# Patient Record
Sex: Male | Born: 2000 | Race: White | Hispanic: No | Marital: Single | Smoking: Never smoker
Health system: Southern US, Community
[De-identification: ages and names within clinical notes are randomized; demographics above are authoritative.]

## PROBLEM LIST (undated history)

## (undated) HISTORY — PX: TONSILLECTOMY: SUR1361

---
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
## 2022-01-21 ENCOUNTER — Telehealth: Payer: Medicaid Other | Admitting: Physician Assistant

## 2022-01-21 DIAGNOSIS — J019 Acute sinusitis, unspecified: Secondary | ICD-10-CM

## 2022-01-21 DIAGNOSIS — B9689 Other specified bacterial agents as the cause of diseases classified elsewhere: Secondary | ICD-10-CM

## 2022-01-21 MED ORDER — AMOXICILLIN-POT CLAVULANATE 875-125 MG PO TABS: 1.0000 | ORAL_TABLET | Freq: Two times a day (BID) | ORAL | 0 refills | Status: DC

## 2022-01-21 MED ORDER — FLUTICASONE PROPIONATE 50 MCG/ACT NA SUSP: 2.0000 | Freq: Every day | NASAL | 0 refills | Status: DC

## 2022-01-21 NOTE — Patient Instructions (Signed)
Charles Whitney, thank you for joining Charles Loveless, PA-C for today's virtual visit.  While this provider is not your primary care provider (PCP), if your PCP is located in our provider database this encounter information will be shared with them immediately following your visit.   A [facility] MyChart account gives you access to today's visit and all your visits, tests, and labs performed at [facility] " click here if you don't have a [facility] MyChart account or go to [email or web address]  Consent: (Patient) Charles Whitney provided verbal consent for this virtual visit at the beginning of the encounter.  Current Medications:  Current Outpatient Medications:    amoxicillin-clavulanate (AUGMENTIN) 875-125 MG tablet, Take 1 tablet by mouth 2 (two) times daily., Disp: 14 tablet, Rfl: 0   fluticasone (FLONASE) 50 MCG/ACT nasal spray, Place 2 sprays into both nostrils daily., Disp: 16 g, Rfl: 0   Medications ordered in this encounter:  Meds ordered this encounter  Medications   amoxicillin-clavulanate (AUGMENTIN) 875-125 MG tablet    Sig: Take 1 tablet by mouth 2 (two) times daily.    Dispense:  14 tablet    Refill:  0    Order Specific Question:   Supervising Provider    Answer:   Charles Whitney [[id number]]   fluticasone (FLONASE) 50 MCG/ACT nasal spray    Sig: Place 2 sprays into both nostrils daily.    Dispense:  16 g    Refill:  0    Order Specific Question:   Supervising Provider    Answer:   Charles Whitney [id number]     *If you need refills on other medications prior to your next appointment, please contact your pharmacy*  Follow-Up: Call back or seek an in-person evaluation if the symptoms worsen or if the condition fails to improve as anticipated.  [facility] [phone]  Other Instructions  Sinus Infection, Adult A sinus infection, also called sinusitis, is inflammation of your sinuses. Sinuses are hollow  spaces in the bones around your face. Your sinuses are located: Around your eyes. In the middle of your forehead. Behind your nose. In your cheekbones. Mucus normally drains out of your sinuses. When your nasal tissues become inflamed or swollen, mucus can become trapped or blocked. This allows bacteria, viruses, and fungi to grow, which leads to infection. Most infections of the sinuses are caused by a virus. A sinus infection can develop quickly. It can last for up to 4 weeks (acute) or for more than 12 weeks (chronic). A sinus infection often develops after a cold. What are the causes? This condition is caused by anything that creates swelling in the sinuses or stops mucus from draining. This includes: Allergies. Asthma. Infection from bacteria or viruses. Deformities or blockages in your nose or sinuses. Abnormal growths in the nose (nasal polyps). Pollutants, such as chemicals or irritants in the air. Infection from fungi. This is rare. What increases the risk? You are more likely to develop this condition if you: Have a weak body defense system (immune system). Do a lot of swimming or diving. Overuse nasal sprays. Smoke. What are the signs or symptoms? The main symptoms of this condition are pain and a feeling of pressure around the affected sinuses. Other symptoms include: Stuffy nose or congestion that makes it difficult to breathe through your nose. Thick yellow or greenish drainage from your nose. Tenderness, swelling, and warmth over the affected sinuses. A cough that may get worse at night.  Decreased sense of smell and taste. Extra mucus that collects in the throat or the back of the nose (postnasal drip) causing a sore throat or bad breath. Tiredness (fatigue). Fever. How is this diagnosed? This condition is diagnosed based on: Your symptoms. Your medical history. A physical exam. Tests to find out if your condition is acute or chronic. This may include: Checking  your nose for nasal polyps. Viewing your sinuses using a device that has a light (endoscope). Testing for allergies or bacteria. Imaging tests, such as an MRI or CT scan. In rare cases, a bone biopsy may be done to rule out more serious types of fungal sinus disease. How is this treated? Treatment for a sinus infection depends on the cause and whether your condition is chronic or acute. If caused by a virus, your symptoms should go away on their own within 10 days. You may be given medicines to relieve symptoms. They include: Medicines that shrink swollen nasal passages (decongestants). A spray that eases inflammation of the nostrils (topical intranasal corticosteroids). Rinses that help get rid of thick mucus in your nose (nasal saline washes). Medicines that treat allergies (antihistamines). Over-the-counter pain relievers. If caused by bacteria, your health care provider may recommend waiting to see if your symptoms improve. Most bacterial infections will get better without antibiotic medicine. You may be given antibiotics if you have: A severe infection. A weak immune system. If caused by narrow nasal passages or nasal polyps, surgery may be needed. Follow these instructions at home: Medicines Take, use, or apply over-the-counter and prescription medicines only as told by your health care provider. These may include nasal sprays. If you were prescribed an antibiotic medicine, take it as told by your health care provider. Do not stop taking the antibiotic even if you start to feel better. Hydrate and humidify  Drink enough fluid to keep your urine pale yellow. Staying hydrated will help to thin your mucus. Use a cool mist humidifier to keep the humidity level in your home above 50%. Inhale steam for 10-15 minutes, 3-4 times a day, or as told by your health care provider. You can do this in the bathroom while a hot shower is running. Limit your exposure to cool or dry air. Rest Rest as  much as possible. Sleep with your head raised (elevated). Make sure you get enough sleep each night. General instructions  Apply a warm, moist washcloth to your face 3-4 times a day or as told by your health care provider. This will help with discomfort. Use nasal saline washes as often as told by your health care provider. Wash your hands often with soap and water to reduce your exposure to germs. If soap and water are not available, use hand sanitizer. Do not smoke. Avoid being around people who are smoking (secondhand smoke). Keep all follow-up visits. This is important. Contact a health care provider if: You have a fever. Your symptoms get worse. Your symptoms do not improve within 10 days. Get help right away if: You have a severe headache. You have persistent vomiting. You have severe pain or swelling around your face or eyes. You have vision problems. You develop confusion. Your neck is stiff. You have trouble breathing. These symptoms may be an emergency. Get help right away. Call 911. Do not wait to see if the symptoms will go away. Do not drive yourself to the hospital. Summary A sinus infection is soreness and inflammation of your sinuses. Sinuses are hollow spaces in the bones  around your face. This condition is caused by nasal tissues that become inflamed or swollen. The swelling traps or blocks the flow of mucus. This allows bacteria, viruses, and fungi to grow, which leads to infection. If you were prescribed an antibiotic medicine, take it as told by your health care provider. Do not stop taking the antibiotic even if you start to feel better. Keep all follow-up visits. This is important. This information is not intended to replace advice given to you by your health care provider. Make sure you discuss any questions you have with your health care provider. Document Revised: 12/05/2020 Document Reviewed: 12/05/2020 Elsevier Patient Education  2023 Elsevier  Inc.    If you have been instructed to have an in-person evaluation today at a local Urgent Care facility, please use the link below. It will take you to a list of all of our available [facility], including address, phone number and hours of operation. Please do not delay care.  [facility]  If you or a family member do not have a primary care provider, use the link below to schedule a visit and establish care. When you choose a [facility] primary care physician or advanced practice provider, you gain a long-term partner in health. Find a Primary Care Provider  Learn more about [facility] in-office and virtual care options: [facility] - Get Care Now

## 2022-01-21 NOTE — Progress Notes (Signed)
Virtual Visit Consent   Charles Whitney, you are scheduled for a virtual visit with a [facility] provider today. Just as with appointments in the office, your consent must be obtained to participate. Your consent will be active for this visit and any virtual visit you may have with one of our providers in the next 365 days. If you have a MyChart account, a copy of this consent can be sent to you electronically.  As this is a virtual visit, video technology does not allow for your provider to perform a traditional examination. This may limit your provider's ability to fully assess your condition. If your provider identifies any concerns that need to be evaluated in person or the need to arrange testing (such as labs, EKG, etc.), we will make arrangements to do so. Although advances in technology are sophisticated, we cannot ensure that it will always work on either your end or our end. If the connection with a video visit is poor, the visit may have to be switched to a telephone visit. With either a video or telephone visit, we are not always able to ensure that we have a secure connection.  By engaging in this virtual visit, you consent to the provision of healthcare and authorize for your insurance to be billed (if applicable) for the services provided during this visit. Depending on your insurance coverage, you may receive a charge related to this service.  I need to obtain your verbal consent now. Are you willing to proceed with your visit today? Charles Whitney has provided verbal consent on 01/21/2022 for a virtual visit (video or telephone). Margaretann Loveless, PA-C  Date: 01/21/2022 3:13 PM  Virtual Visit via Video Note   I, Margaretann Loveless, connected with  Charles Whitney  ([id number], [birth year]) on 01/21/22 at  3:00 PM EST by a video-enabled telemedicine application and verified that I am speaking with the correct person using two identifiers.  Location: Patient:  Virtual Visit Location Patient: Home Provider: Virtual Visit Location Provider: Home Office   I discussed the limitations of evaluation and management by telemedicine and the availability of in person appointments. The patient expressed understanding and agreed to proceed.    History of Present Illness: Charles Whitney is a 22 y.o. who identifies as a male who was assigned male at birth, and is being seen today for URI symptoms.  HPI: URI  This is a new problem. The current episode started in the past 7 days. The problem has been gradually worsening. Maximum temperature: has not taken temperature but feels really hot. Associated symptoms include chest pain (heaviness), congestion, coughing (more from throat irritation), headaches, nausea, rhinorrhea (post nasal drainage), sinus pain and a sore throat. Pertinent negatives include no diarrhea, ear pain, plugged ear sensation, vomiting or wheezing. Treatments tried: dayquil. The treatment provided no relief.     Problems:  Patient Active Problem List   Diagnosis Date Noted   Constipation 11/13/2020   Need for influenza vaccination 11/13/2020    Allergies: No Known Allergies Medications:  Current Outpatient Medications:    amoxicillin-clavulanate (AUGMENTIN) 875-125 MG tablet, Take 1 tablet by mouth 2 (two) times daily., Disp: 14 tablet, Rfl: 0   fluticasone (FLONASE) 50 MCG/ACT nasal spray, Place 2 sprays into both nostrils daily., Disp: 16 g, Rfl: 0  Observations/Objective: Patient is well-developed, well-nourished in no acute distress.  Resting comfortably  at home.  Head is normocephalic, atraumatic.  No labored breathing.  Speech is clear and coherent  with logical content.  Patient is alert and oriented at baseline.    Assessment and Plan: 1. Acute bacterial sinusitis - amoxicillin-clavulanate (AUGMENTIN) 875-125 MG tablet; Take 1 tablet by mouth 2 (two) times daily.  Dispense: 14 tablet; Refill: 0 - fluticasone (FLONASE)  50 MCG/ACT nasal spray; Place 2 sprays into both nostrils daily.  Dispense: 16 g; Refill: 0  - Worsening symptoms that have not responded to OTC medications.  - Will give Augmentin and Flonase - Continue allergy medications.  - Steam and humidifier can help - Stay well hydrated and get plenty of rest.  - Seek in person evaluation if no symptom improvement or if symptoms worsen   Follow Up Instructions: I discussed the assessment and treatment plan with the patient. The patient was provided an opportunity to ask questions and all were answered. The patient agreed with the plan and demonstrated an understanding of the instructions.  A copy of instructions were sent to the patient via MyChart unless otherwise noted below.    The patient was advised to call back or seek an in-person evaluation if the symptoms worsen or if the condition fails to improve as anticipated.  Time:  I spent 8 minutes with the patient via telehealth technology discussing the above problems/concerns.    Mar Daring, PA-C

## 2022-01-24 ENCOUNTER — Encounter: Payer: Self-pay | Admitting: Physician Assistant

## 2022-01-24 ENCOUNTER — Ambulatory Visit (INDEPENDENT_AMBULATORY_CARE_PROVIDER_SITE_OTHER): Payer: Medicaid Other | Admitting: Physician Assistant

## 2022-01-24 VITALS — BP 140/74 | HR 96 | Temp 98.5°F | Wt 330.2 lb

## 2022-01-24 DIAGNOSIS — J019 Acute sinusitis, unspecified: Secondary | ICD-10-CM | POA: Diagnosis not present

## 2022-01-24 DIAGNOSIS — B9689 Other specified bacterial agents as the cause of diseases classified elsewhere: Secondary | ICD-10-CM | POA: Diagnosis not present

## 2022-01-24 NOTE — Progress Notes (Signed)
Acute Office Visit   Patient: Charles Whitney   DOB: January 10, 2001   22 y.o. Male  MRN: [id number] Visit Date: 01/24/2022  Today's healthcare provider: Dani Gobble Wetzel Meester, PA-C  Introduced myself to the patient as a Journalist, newspaper and provided education on APPs in clinical practice.    Chief Complaint  Patient presents with   Sinusitis    Pt states he has had congestion, sinus pressure, headache, and a cough for the last week. States he did a virtual visit 01/21/22 and was given nasal spray and Augmentin. States he is currently taking these and is on day 3 of the antibiotic.    Subjective    Sinusitis Associated symptoms include congestion, coughing, sinus pressure and a sore throat. Pertinent negatives include no chills, headaches or shortness of breath.   HPI     Sinusitis    Additional comments: Pt states he has had congestion, sinus pressure, headache, and a cough for the last week. States he did a virtual visit 01/21/22 and was given nasal spray and Augmentin. States he is currently taking these and is on day 3 of the antibiotic.       Last edited by Georgina Peer, CMA on 01/24/2022  3:21 PM.      Patient was seen via virtual visit and was diagnosed with acute bacterial sinusitis He is on day 3 of Augmentin course  Reports for the past week he has had congestion and rhinorrhea (started Saturday with stuffy nose)  He reports he has developed a mild cough and feels like his chest is tight  Interventions: nasal spray, Augmentin, advil PRN    Medications: Outpatient Medications Prior to Visit  Medication Sig   amoxicillin-clavulanate (AUGMENTIN) 875-125 MG tablet Take 1 tablet by mouth 2 (two) times daily.   fluticasone (FLONASE) 50 MCG/ACT nasal spray Place 2 sprays into both nostrils daily.   No facility-administered medications prior to visit.    Review of Systems  Constitutional:  Negative for chills and fever.  HENT:  Positive for congestion, rhinorrhea, sinus  pressure and sore throat. Negative for postnasal drip and sinus pain.   Respiratory:  Positive for cough and chest tightness. Negative for shortness of breath and wheezing.   Gastrointestinal:  Negative for diarrhea, nausea and vomiting.  Musculoskeletal:  Negative for myalgias.  Neurological:  Negative for dizziness, light-headedness and headaches.       Objective    BP (!) 140/74   Pulse 96   Temp 98.5 F (36.9 C) (Oral)   Wt (!) 330 lb 3.2 oz (149.8 kg)   SpO2 98%   BMI 41.62 kg/m    Physical Exam Vitals reviewed.  Constitutional:      General: He is awake.     Appearance: Normal appearance. He is well-developed and well-groomed.  HENT:     Head: Normocephalic and atraumatic.     Right Ear: Hearing and ear canal normal. Tympanic membrane is erythematous. Tympanic membrane is not injected, scarred, perforated, retracted or bulging.     Left Ear: Hearing, tympanic membrane and ear canal normal. Tympanic membrane is not injected, scarred, perforated, erythematous, retracted or bulging.     Mouth/Throat:     Lips: Pink.     Mouth: Mucous membranes are moist.     Pharynx: Oropharynx is clear. Uvula midline. No pharyngeal swelling, oropharyngeal exudate, posterior oropharyngeal erythema or uvula swelling.     Tonsils: No tonsillar exudate.  Eyes:  Extraocular Movements: Extraocular movements intact.     Conjunctiva/sclera: Conjunctivae normal.  Cardiovascular:     Rate and Rhythm: Normal rate and regular rhythm.     Heart sounds: Normal heart sounds. No murmur heard.    No friction rub. No gallop.  Pulmonary:     Effort: Pulmonary effort is normal.     Breath sounds: Normal breath sounds. No decreased air movement. No decreased breath sounds, wheezing, rhonchi or rales.  Lymphadenopathy:     Head:     Right side of head: No submental, submandibular or preauricular adenopathy.     Left side of head: No submental, submandibular or preauricular adenopathy.     Cervical:  No cervical adenopathy.     Right cervical: No superficial or posterior cervical adenopathy.    Left cervical: No superficial or posterior cervical adenopathy.     Upper Body:     Right upper body: No supraclavicular adenopathy.     Left upper body: No supraclavicular adenopathy.  Neurological:     General: No focal deficit present.     Mental Status: He is alert and oriented to person, place, and time.  Psychiatric:        Attention and Perception: Attention and perception normal.        Mood and Affect: Mood and affect normal.        Speech: Speech normal.        Behavior: Behavior normal. Behavior is cooperative.        Thought Content: Thought content normal.        Judgment: Judgment normal.       No results found for any visits on 01/24/22.  Assessment & Plan      No follow-ups on file.     Problem List Items Addressed This Visit   None Visit Diagnoses     Acute bacterial sinusitis    -  Primary Acute, new concern, ongoing  He was seen for this via virtual visit on 01/21/22 and provided with Augmentin and Flonase  He reports some lingering symptoms Reviewed OTC medications for symptomatic management Recommend he continue Abx and reviewed that it may take a few more days for abx to start showing notable improvements Follow up as needed for persistent or progressing symptoms        No follow-ups on file.   I, Khamille Beynon E Kc Summerson, PA-C, have reviewed all documentation for this visit. The documentation on 01/25/22 for the exam, diagnosis, procedures, and orders are all accurate and complete.   Talitha Givens, MHS, PA-C [facility]

## 2022-04-21 DIAGNOSIS — E781 Pure hyperglyceridemia: Secondary | ICD-10-CM | POA: Insufficient documentation

## 2022-04-26 ENCOUNTER — Encounter: Payer: Medicaid Other | Admitting: Nurse Practitioner

## 2022-04-26 DIAGNOSIS — E559 Vitamin D deficiency, unspecified: Secondary | ICD-10-CM

## 2022-04-26 DIAGNOSIS — Z114 Encounter for screening for human immunodeficiency virus [HIV]: Secondary | ICD-10-CM

## 2022-04-26 DIAGNOSIS — Z1159 Encounter for screening for other viral diseases: Secondary | ICD-10-CM

## 2022-04-26 DIAGNOSIS — Z0000 Encounter for general adult medical examination without abnormal findings: Secondary | ICD-10-CM

## 2022-04-26 DIAGNOSIS — E781 Pure hyperglyceridemia: Secondary | ICD-10-CM

## 2022-04-26 DIAGNOSIS — Z23 Encounter for immunization: Secondary | ICD-10-CM

## 2022-05-17 ENCOUNTER — Encounter: Payer: Self-pay | Admitting: Nurse Practitioner

## 2022-05-17 ENCOUNTER — Ambulatory Visit (INDEPENDENT_AMBULATORY_CARE_PROVIDER_SITE_OTHER): Payer: Medicaid Other | Admitting: Nurse Practitioner

## 2022-05-17 VITALS — BP 125/82 | HR 101 | Temp 98.5°F | Ht 74.69 in | Wt 334.4 lb

## 2022-05-17 DIAGNOSIS — Z0000 Encounter for general adult medical examination without abnormal findings: Secondary | ICD-10-CM | POA: Diagnosis not present

## 2022-05-17 DIAGNOSIS — F419 Anxiety disorder, unspecified: Secondary | ICD-10-CM | POA: Diagnosis not present

## 2022-05-17 DIAGNOSIS — F32A Depression, unspecified: Secondary | ICD-10-CM

## 2022-05-17 DIAGNOSIS — R238 Other skin changes: Secondary | ICD-10-CM

## 2022-05-17 DIAGNOSIS — E781 Pure hyperglyceridemia: Secondary | ICD-10-CM

## 2022-05-17 DIAGNOSIS — Z23 Encounter for immunization: Secondary | ICD-10-CM

## 2022-05-17 DIAGNOSIS — E559 Vitamin D deficiency, unspecified: Secondary | ICD-10-CM | POA: Diagnosis not present

## 2022-05-17 DIAGNOSIS — Z1159 Encounter for screening for other viral diseases: Secondary | ICD-10-CM

## 2022-05-17 DIAGNOSIS — Z114 Encounter for screening for human immunodeficiency virus [HIV]: Secondary | ICD-10-CM

## 2022-05-17 DIAGNOSIS — Z833 Family history of diabetes mellitus: Secondary | ICD-10-CM

## 2022-05-17 MED ORDER — MUPIROCIN 2 % EX OINT: 1.0000 | TOPICAL_OINTMENT | Freq: Two times a day (BID) | CUTANEOUS | 4 refills | Status: DC

## 2022-05-17 NOTE — Assessment & Plan Note (Signed)
Ongoing on past labs, recheck lipid panel today and recommend heavy focus on diet changes and regular exercise at home.  Has a more sedentary job at present.

## 2022-05-17 NOTE — Patient Instructions (Signed)

## 2022-05-17 NOTE — Assessment & Plan Note (Signed)
Will check A1c annually and initiate treatment if needed in future.

## 2022-05-17 NOTE — Progress Notes (Signed)
BP 125/82   Pulse (!) 101   Temp 98.5 F (36.9 C) (Oral)   Ht 6' 2.69" (1.897 m)   Wt (!) 334 lb 6.4 oz (151.7 kg)   SpO2 97%   BMI 42.15 kg/m    Subjective:    Patient ID: Charles Whitney, male    DOB: 13-Sep-2000, 22 y.o.   MRN: [id number]  HPI: Zyheem Traywick is a 22 y.o. male presenting on 05/17/2022 for comprehensive medical examination. Current medical complaints include:none  He currently lives with: room mate Interim Problems from his last visit: no  DEPRESSION Currently mood scores elevated -- multiple things causing stress at this time, lots of small things.  Tried Melatonin before.   Mood status: uncontrolled -- does not wish to start medication Psychotherapy/counseling: none Previous psychiatric medications: none Depressed mood: yes Anxious mood: no Anhedonia:  a little bit Significant weight loss or gain: no Insomnia: yes hard to fall asleep Fatigue: yes Feelings of worthlessness or guilt: no Impaired concentration/indecisiveness: yes Suicidal ideations: no Hopelessness: no Crying spells: no    05/17/2022    2:28 PM 01/24/2022    3:21 PM 11/13/2020    9:58 AM  Depression screen PHQ 2/9  Decreased Interest 2 1 3   Down, Depressed, Hopeless 1 1 1   PHQ - 2 Score 3 2 4   Altered sleeping 3 3 3   Tired, decreased energy 3 2 3   Change in appetite 1 1 1   Feeling bad or failure about yourself  0 1 1  Trouble concentrating 3 3 3   Moving slowly or fidgety/restless 2 1 3   Suicidal thoughts 0 0 0  PHQ-9 Score 15 13 18   Difficult doing work/chores Somewhat difficult Somewhat difficult Somewhat difficult       05/17/2022    2:29 PM 01/24/2022    3:22 PM 11/13/2020    9:58 AM  GAD 7 : Generalized Anxiety Score  Nervous, Anxious, on Edge 2 1 2   Control/stop worrying 1 0 1  Worry too much - different things 2 1 3   Trouble relaxing 3 3 2   Restless 1 2 3   Easily annoyed or irritable 1 1 1   Afraid - awful might happen 1 1 1   Total GAD 7 Score 11 9 13    Anxiety Difficulty Somewhat difficult Somewhat difficult Somewhat difficult      04/21/2019   12:43 PM 11/13/2020    9:57 AM 01/24/2022    3:21 PM 05/17/2022    2:28 PM 05/17/2022    2:42 PM  Fall Risk  Falls in the past year?  0 0 0 0  Was there an injury with Fall?  0 0 0 0  Fall Risk Category Calculator  0 0 0 0  Fall Risk Category (Retired)  Low Low    (RETIRED) Patient Fall Risk Level Low fall risk  Low fall risk    Patient at Risk for Falls Due to  No Fall Risks No Fall Risks No Fall Risks No Fall Risks  Fall risk Follow up  Falls evaluation completed Falls evaluation completed  Falls prevention discussed    Past Medical History:  History reviewed. No pertinent past medical history.  Surgical History:  Past Surgical History:  Procedure Laterality Date   TONSILLECTOMY      Medications:  No current outpatient medications on file prior to visit.   No current facility-administered medications on file prior to visit.    Allergies:  No Known Allergies  Social History:  Social History  Socioeconomic History   Marital status: Single    Spouse name: Not on file   Number of children: Not on file   Years of education: Not on file   Highest education level: Not on file  Occupational History   Not on file  Tobacco Use   Smoking status: Never   Smokeless tobacco: Never  Vaping Use   Vaping Use: Every day  Substance and Sexual Activity   Alcohol use: Yes    Comment: on occasion   Drug use: No   Sexual activity: Not on file  Other Topics Concern   Not on file  Social History Narrative   Not on file   Social Determinants of Health   Financial Resource Strain: Not on file  Food Insecurity: Not on file  Transportation Needs: Not on file  Physical Activity: Not on file  Stress: Not on file  Social Connections: Not on file  Intimate Partner Violence: Not on file   Social History   Tobacco Use  Smoking Status Never  Smokeless Tobacco Never   Social History    Substance and Sexual Activity  Alcohol Use Yes   Comment: on occasion    Family History:  History reviewed. No pertinent family history.  Past medical history, surgical history, medications, allergies, family history and social history reviewed with patient today and changes made to appropriate areas of the chart.   ROS All other ROS negative except what is listed above and in the HPI.      Objective:    BP 125/82   Pulse (!) 101   Temp 98.5 F (36.9 C) (Oral)   Ht 6' 2.69" (1.897 m)   Wt (!) 334 lb 6.4 oz (151.7 kg)   SpO2 97%   BMI 42.15 kg/m   Wt Readings from Last 3 Encounters:  05/17/22 (!) 334 lb 6.4 oz (151.7 kg)  01/24/22 (!) 330 lb 3.2 oz (149.8 kg)  11/13/20 (!) 328 lb 6.4 oz (149 kg) (>99 %, Z= 3.36)*   * Growth percentiles are based on CDC (Boys, 2-20 Years) data.    Physical Exam Vitals and nursing note reviewed.  Constitutional:      General: He is awake. He is not in acute distress.    Appearance: He is well-developed and well-groomed. He is obese. He is not ill-appearing or toxic-appearing.  HENT:     Head: Normocephalic and atraumatic.     Right Ear: Hearing, tympanic membrane, ear canal and external ear normal. No drainage.     Left Ear: Hearing, tympanic membrane, ear canal and external ear normal. No drainage.     Nose: Nose normal.     Mouth/Throat:     Pharynx: Uvula midline. No pharyngeal swelling, oropharyngeal exudate or posterior oropharyngeal erythema.  Eyes:     General: Lids are normal.        Right eye: No discharge.        Left eye: No discharge.     Extraocular Movements: Extraocular movements intact.     Conjunctiva/sclera: Conjunctivae normal.     Pupils: Pupils are equal, round, and reactive to light.     Visual Fields: Right eye visual fields normal and left eye visual fields normal.  Neck:     Thyroid: No thyromegaly.     Vascular: No carotid bruit or JVD.     Trachea: Trachea normal.  Cardiovascular:     Rate and  Rhythm: Normal rate and regular rhythm.     Heart sounds: Normal  heart sounds, S1 normal and S2 normal. No murmur heard.    No gallop.  Pulmonary:     Effort: Pulmonary effort is normal. No accessory muscle usage or respiratory distress.     Breath sounds: Normal breath sounds.  Abdominal:     General: Bowel sounds are normal.     Palpations: Abdomen is soft. There is no hepatomegaly or splenomegaly.     Tenderness: There is no abdominal tenderness.  Musculoskeletal:        General: Normal range of motion.     Cervical back: Normal range of motion and neck supple.     Right lower leg: No edema.     Left lower leg: No edema.  Lymphadenopathy:     Head:     Right side of head: No submental, submandibular, tonsillar, preauricular or posterior auricular adenopathy.     Left side of head: No submental, submandibular, tonsillar, preauricular or posterior auricular adenopathy.     Cervical: No cervical adenopathy.  Skin:    General: Skin is warm and dry.     Capillary Refill: Capillary refill takes less than 2 seconds.     Findings: No rash.     Comments: To bilateral shins healed areas of old scratch marks due to reported small blisters that appear on occasion and he picks at them.  No erythema or edema to areas.  Neurological:     Mental Status: He is alert and oriented to person, place, and time.     Gait: Gait is intact.     Deep Tendon Reflexes: Reflexes are normal and symmetric.     Reflex Scores:      Brachioradialis reflexes are 2+ on the right side and 2+ on the left side.      Patellar reflexes are 2+ on the right side and 2+ on the left side. Psychiatric:        Attention and Perception: Attention normal.        Mood and Affect: Mood normal.        Speech: Speech normal.        Behavior: Behavior normal. Behavior is cooperative.        Thought Content: Thought content normal.     Results for orders placed or performed in visit on 12/04/20  Urinalysis, Routine w reflex  microscopic  Result Value Ref Range   Specific Gravity, UA >1.030 (H) 1.005 - 1.030   pH, UA 6.0 5.0 - 7.5   Color, UA Yellow Yellow   Appearance Ur Clear Clear   Leukocytes,UA Negative Negative   Protein,UA Negative Negative/Trace   Glucose, UA Negative Negative   Ketones, UA Negative Negative   RBC, UA Negative Negative   Bilirubin, UA Negative Negative   Urobilinogen, Ur 0.2 0.2 - 1.0 mg/dL   Nitrite, UA Negative Negative  Comprehensive metabolic panel  Result Value Ref Range   Glucose 106 (H) 70 - 99 mg/dL   BUN 14 6 - 20 mg/dL   Creatinine, Ser 0.98 0.76 - 1.27 mg/dL   eGFR 119 >14 NW/GNF/6.21   BUN/Creatinine Ratio 14 9 - 20   Sodium 142 134 - 144 mmol/L   Potassium 4.2 3.5 - 5.2 mmol/L   Chloride 103 96 - 106 mmol/L   CO2 26 20 - 29 mmol/L   Calcium 9.6 8.7 - 10.2 mg/dL   Total Protein 6.9 6.0 - 8.5 g/dL   Albumin 4.6 4.1 - 5.2 g/dL   Globulin, Total 2.3 1.5 - 4.5 g/dL  Albumin/Globulin Ratio 2.0 1.2 - 2.2   Bilirubin Total 0.5 0.0 - 1.2 mg/dL   Alkaline Phosphatase 88 51 - 125 IU/L   AST 22 0 - 40 IU/L   ALT 54 (H) 0 - 44 IU/L  CBC with Differential/Platelet  Result Value Ref Range   WBC 12.9 (H) 3.4 - 10.8 x10E3/uL   RBC 5.11 4.14 - 5.80 x10E6/uL   Hemoglobin 15.2 13.0 - 17.7 g/dL   Hematocrit 81.1 91.4 - 51.0 %   MCV 89 79 - 97 fL   MCH 29.7 26.6 - 33.0 pg   MCHC 33.3 31.5 - 35.7 g/dL   RDW 78.2 95.6 - 21.3 %   Platelets 268 150 - 450 x10E3/uL   Neutrophils 58 Not Estab. %   Lymphs 28 Not Estab. %   Monocytes 10 Not Estab. %   Eos 2 Not Estab. %   Basos 1 Not Estab. %   Neutrophils Absolute 7.6 (H) 1.4 - 7.0 x10E3/uL   Lymphocytes Absolute 3.5 (H) 0.7 - 3.1 x10E3/uL   Monocytes Absolute 1.3 (H) 0.1 - 0.9 x10E3/uL   EOS (ABSOLUTE) 0.2 0.0 - 0.4 x10E3/uL   Basophils Absolute 0.1 0.0 - 0.2 x10E3/uL   Immature Granulocytes 1 Not Estab. %   Immature Grans (Abs) 0.1 0.0 - 0.1 x10E3/uL  Lipid panel  Result Value Ref Range   Cholesterol, Total 171 (H) 100  - 169 mg/dL   Triglycerides 086 (H) 0 - 89 mg/dL   HDL 29 (L) >57 mg/dL   VLDL Cholesterol Cal 43 (H) 5 - 40 mg/dL   LDL Chol Calc (NIH) 99 0 - 109 mg/dL   Chol/HDL Ratio 5.9 (H) 0.0 - 5.0 ratio  TSH  Result Value Ref Range   TSH 1.490 0.450 - 4.500 uIU/mL      Assessment & Plan:   Problem List Items Addressed This Visit       Other   Anxiety and depression    Noted on GAD and PHQ scoring today.  He denies SI/HI and does not want to start medication at this time.  Has more sedentary job, not outside often.  Recommend daily walks to help with evening sleep pattern and taking OTC Olly Brand Sleep gummies or Stress relief gummies.  Return to office if any worsening symptoms.      Relevant Orders   CBC with Differential/Platelet   Comprehensive metabolic panel   TSH   Family history of diabetes mellitus    Will check A1c annually and initiate treatment if needed in future.      Relevant Orders   HgB A1c   Hypertriglyceridemia - Primary    Ongoing on past labs, recheck lipid panel today and recommend heavy focus on diet changes and regular exercise at home.  Has a more sedentary job at present.      Relevant Orders   Comprehensive metabolic panel   Lipid Panel w/o Chol/HDL Ratio   Other Visit Diagnoses     Vitamin D deficiency       Noted on past labs per report, check today and start supplement as needed.   Relevant Orders   VITAMIN D 25 Hydroxy (Vit-D Deficiency, Fractures)   Skin irritation       Mupirocin sent to place on areas on bilateral shins and recommend no picking.   Need for hepatitis C screening test       Hep C screen on labs today per guidelines for one time screening, discussed with patient.  Relevant Orders   Hepatitis C antibody   Encounter for screening for HIV       HIV screen on labs today per guidelines for one time screening, discussed with patient.   Relevant Orders   HIV Antibody (routine testing w rflx)   Need for Td vaccine       Td  vaccine in office today, educated patient.   Relevant Orders   Td vaccine greater than or equal to 7yo preservative free IM   Encounter for annual physical exam       Annual physical today with labs and health maintenance reviewed, discussed with patient.        LABORATORY TESTING:  Health maintenance labs ordered today as discussed above.   IMMUNIZATIONS:   - Tdap: Tetanus vaccination status reviewed: Td vaccination indicated and given today. - Influenza: Up to date - Pneumovax: Not applicable - Prevnar: Not applicable - Zostavax vaccine: Not applicable  SCREENING: - Colonoscopy: Not applicable  Discussed with patient purpose of the colonoscopy is to detect colon cancer at curable precancerous or early stages   - AAA Screening: Not applicable  -Hearing Test: Not applicable  -Spirometry: Not applicable   PATIENT COUNSELING:    Sexuality: Discussed sexually transmitted diseases, partner selection, use of condoms, avoidance of unintended pregnancy  and contraceptive alternatives.   Advised to avoid cigarette smoking.  I discussed with the patient that most people either abstain from alcohol or drink within safe limits (<=14/week and <=4 drinks/occasion for males, <=7/weeks and <= 3 drinks/occasion for females) and that the risk for alcohol disorders and other health effects rises proportionally with the number of drinks per week and how often a drinker exceeds daily limits.  Discussed cessation/primary prevention of drug use and availability of treatment for abuse.   Diet: Encouraged to adjust caloric intake to maintain  or achieve ideal body weight, to reduce intake of dietary saturated fat and total fat, to limit sodium intake by avoiding high sodium foods and not adding table salt, and to maintain adequate dietary potassium and calcium preferably from fresh fruits, vegetables, and low-fat dairy products.    Stressed the importance of regular exercise  Injury prevention:  Discussed safety belts, safety helmets, smoke detector, smoking near bedding or upholstery.   Dental health: Discussed importance of regular tooth brushing, flossing, and dental visits.   Follow up plan: NEXT PREVENTATIVE PHYSICAL DUE IN 1 YEAR. Return in about 1 year (around 05/17/2023) for Annual physical.

## 2022-05-17 NOTE — Assessment & Plan Note (Signed)
Noted on GAD and PHQ scoring today.  He denies SI/HI and does not want to start medication at this time.  Has more sedentary job, not outside often.  Recommend daily walks to help with evening sleep pattern and taking OTC Olly Brand Sleep gummies or Stress relief gummies.  Return to office if any worsening symptoms.

## 2022-05-18 ENCOUNTER — Encounter: Payer: Self-pay | Admitting: Nurse Practitioner

## 2022-05-18 ENCOUNTER — Other Ambulatory Visit: Payer: Self-pay | Admitting: Nurse Practitioner

## 2022-05-18 LAB — CBC WITH DIFFERENTIAL/PLATELET
Basophils Absolute: 0.1 10*3/uL (ref 0.0–0.2)
Basos: 1 %
EOS (ABSOLUTE): 0.1 10*3/uL (ref 0.0–0.4)
Eos: 1 %
Hematocrit: 45.9 % (ref 37.5–51.0)
Hemoglobin: 15.7 g/dL (ref 13.0–17.7)
Immature Grans (Abs): 0.1 10*3/uL (ref 0.0–0.1)
Immature Granulocytes: 1 %
Lymphocytes Absolute: 3.2 10*3/uL — ABNORMAL HIGH (ref 0.7–3.1)
Lymphs: 27 %
MCH: 30.3 pg (ref 26.6–33.0)
MCHC: 34.2 g/dL (ref 31.5–35.7)
MCV: 89 fL (ref 79–97)
Monocytes Absolute: 0.9 10*3/uL (ref 0.1–0.9)
Monocytes: 8 %
Neutrophils Absolute: 7.4 10*3/uL — ABNORMAL HIGH (ref 1.4–7.0)
Neutrophils: 62 %
Platelets: 300 10*3/uL (ref 150–450)
RBC: 5.18 x10E6/uL (ref 4.14–5.80)
RDW: 12.7 % (ref 11.6–15.4)
WBC: 11.7 10*3/uL — ABNORMAL HIGH (ref 3.4–10.8)

## 2022-05-18 LAB — COMPREHENSIVE METABOLIC PANEL
ALT: 47 IU/L — ABNORMAL HIGH (ref 0–44)
AST: 25 IU/L (ref 0–40)
Albumin/Globulin Ratio: 2 (ref 1.2–2.2)
Albumin: 4.6 g/dL (ref 4.3–5.2)
Alkaline Phosphatase: 89 IU/L (ref 44–121)
BUN/Creatinine Ratio: 13 (ref 9–20)
BUN: 13 mg/dL (ref 6–20)
Bilirubin Total: 0.8 mg/dL (ref 0.0–1.2)
CO2: 20 mmol/L (ref 20–29)
Calcium: 9.4 mg/dL (ref 8.7–10.2)
Chloride: 102 mmol/L (ref 96–106)
Creatinine, Ser: 1.03 mg/dL (ref 0.76–1.27)
Globulin, Total: 2.3 g/dL (ref 1.5–4.5)
Glucose: 90 mg/dL (ref 70–99)
Potassium: 4 mmol/L (ref 3.5–5.2)
Sodium: 143 mmol/L (ref 134–144)
Total Protein: 6.9 g/dL (ref 6.0–8.5)
eGFR: 106 mL/min/{1.73_m2} (ref >59)

## 2022-05-18 LAB — LIPID PANEL W/O CHOL/HDL RATIO
Cholesterol, Total: 198 mg/dL (ref 100–199)
HDL: 31 mg/dL — ABNORMAL LOW (ref >39)
LDL Chol Calc (NIH): 108 mg/dL — ABNORMAL HIGH (ref 0–99)
Triglycerides: 341 mg/dL — ABNORMAL HIGH (ref 0–149)
VLDL Cholesterol Cal: 59 mg/dL — ABNORMAL HIGH (ref 5–40)

## 2022-05-18 LAB — HEPATITIS C ANTIBODY: Hep C Virus Ab: NONREACTIVE

## 2022-05-18 LAB — HIV ANTIBODY (ROUTINE TESTING W REFLEX): HIV Screen 4th Generation wRfx: NONREACTIVE

## 2022-05-18 LAB — VITAMIN D 25 HYDROXY (VIT D DEFICIENCY, FRACTURES): Vit D, 25-Hydroxy: 13.7 ng/mL — ABNORMAL LOW (ref 30.0–100.0)

## 2022-05-18 LAB — HEMOGLOBIN A1C
Est. average glucose Bld gHb Est-mCnc: 103 mg/dL
Hgb A1c MFr Bld: 5.2 % (ref 4.8–5.6)

## 2022-05-18 LAB — TSH: TSH: 1.58 u[IU]/mL (ref 0.450–4.500)

## 2022-05-18 MED ORDER — CHOLECALCIFEROL 1.25 MG (50000 UT) PO TABS: 1.0000 | ORAL_TABLET | ORAL | 4 refills | Status: DC

## 2022-05-18 NOTE — Progress Notes (Signed)
Contacted via MyChart -- need lab only visit in 4 weeks please Good evening Charles Whitney, your labs have returned: - CBC shows mild elevation in white blood cell count and lymphocytes + neutrophils.  Have you been sick recently? Coughing or any urinary symptoms?  Please let me know. I would like to recheck this via an outpatient lab visit only in 4 weeks. - Kidney function, creatinine and eGFR, remains normal, liver function, AST and ALT, shows mild elevation in ALT we will monitor closely -- ensure to minimize any alcohol or Tylenol use if present at home. - Cholesterol levels remain elevated, I recommend heavy focus on healthy diet changes and regular exercise = at least 30 minutes 5 days a week. - Thyroid testing normal and A1c shows no prediabetes or diabetes. - HIV and Hep C are negative. - Vitamin D is very low.  I will send in a weekly supplement for you to start taking for this.  Any questions? Keep being amazing!!  Thank you for allowing me to participate in your care.  I appreciate you. Kindest regards, Celina Shiley

## 2022-05-21 NOTE — Progress Notes (Signed)
Attempted to reach patient LVM regarding provider wanting patient to have a 4 week lab only follow up.  Also put in CRM.

## 2022-05-22 ENCOUNTER — Other Ambulatory Visit: Payer: Self-pay | Admitting: Nurse Practitioner

## 2022-05-22 DIAGNOSIS — R7989 Other specified abnormal findings of blood chemistry: Secondary | ICD-10-CM

## 2022-05-22 NOTE — Telephone Encounter (Signed)
Scheduled patient for 4 week labs on 06/14/2022 @ 8:20 am.

## 2022-06-14 ENCOUNTER — Other Ambulatory Visit: Payer: Medicaid Other

## 2022-06-14 DIAGNOSIS — R7989 Other specified abnormal findings of blood chemistry: Secondary | ICD-10-CM

## 2022-06-15 LAB — CBC WITH DIFFERENTIAL/PLATELET
Basophils Absolute: 0.1 10*3/uL (ref 0.0–0.2)
Basos: 1 %
EOS (ABSOLUTE): 0.1 10*3/uL (ref 0.0–0.4)
Eos: 1 %
Hematocrit: 45.1 % (ref 37.5–51.0)
Hemoglobin: 15 g/dL (ref 13.0–17.7)
Immature Grans (Abs): 0.1 10*3/uL (ref 0.0–0.1)
Immature Granulocytes: 1 %
Lymphocytes Absolute: 4 10*3/uL — ABNORMAL HIGH (ref 0.7–3.1)
Lymphs: 37 %
MCH: 29.8 pg (ref 26.6–33.0)
MCHC: 33.3 g/dL (ref 31.5–35.7)
MCV: 90 fL (ref 79–97)
Monocytes Absolute: 0.9 10*3/uL (ref 0.1–0.9)
Monocytes: 8 %
Neutrophils Absolute: 5.6 10*3/uL (ref 1.4–7.0)
Neutrophils: 52 %
Platelets: 267 10*3/uL (ref 150–450)
RBC: 5.04 x10E6/uL (ref 4.14–5.80)
RDW: 12.5 % (ref 11.6–15.4)
WBC: 10.7 10*3/uL (ref 3.4–10.8)

## 2022-06-15 LAB — C-REACTIVE PROTEIN: CRP: <1 mg/L (ref 0–10)

## 2022-06-15 LAB — SEDIMENTATION RATE: Sed Rate: 2 mm/hr (ref 0–15)

## 2022-06-16 ENCOUNTER — Other Ambulatory Visit: Payer: Self-pay | Admitting: Nurse Practitioner

## 2022-06-16 DIAGNOSIS — R7989 Other specified abnormal findings of blood chemistry: Secondary | ICD-10-CM

## 2022-06-16 NOTE — Progress Notes (Signed)
Contacted via MyChart -- need lab only visit in 3 months please.   Good afternoon Charles Whitney, your labs have returned and white blood cell count has trended to normal.  Lymphocytes still a little elevated, but neutrophils normal.  Good news.  I would like to recheck in a few months outpatient again to ensure stability.  Any questions?

## 2022-06-17 NOTE — Progress Notes (Signed)
Attempted to call patient to get scheduled for 3 months lab only visit, LVM to call office back.  Ok for [facility] to schedule.

## 2022-07-22 ENCOUNTER — Other Ambulatory Visit: Payer: Medicaid Other

## 2022-07-22 DIAGNOSIS — R7989 Other specified abnormal findings of blood chemistry: Secondary | ICD-10-CM

## 2022-07-23 ENCOUNTER — Other Ambulatory Visit: Payer: Self-pay | Admitting: Nurse Practitioner

## 2022-07-23 ENCOUNTER — Encounter: Payer: Self-pay | Admitting: Nurse Practitioner

## 2022-07-23 DIAGNOSIS — D72820 Lymphocytosis (symptomatic): Secondary | ICD-10-CM

## 2022-07-23 LAB — CBC WITH DIFFERENTIAL/PLATELET
Basophils Absolute: 0.1 10*3/uL (ref 0.0–0.2)
Basos: 1 %
EOS (ABSOLUTE): 0.1 10*3/uL (ref 0.0–0.4)
Eos: 1 %
Hematocrit: 48 % (ref 37.5–51.0)
Hemoglobin: 15.5 g/dL (ref 13.0–17.7)
Immature Grans (Abs): 0.1 10*3/uL (ref 0.0–0.1)
Immature Granulocytes: 1 %
Lymphocytes Absolute: 4.2 10*3/uL — ABNORMAL HIGH (ref 0.7–3.1)
Lymphs: 31 %
MCH: 29.6 pg (ref 26.6–33.0)
MCHC: 32.3 g/dL (ref 31.5–35.7)
MCV: 92 fL (ref 79–97)
Monocytes Absolute: 1 10*3/uL — ABNORMAL HIGH (ref 0.1–0.9)
Monocytes: 7 %
Neutrophils Absolute: 8 10*3/uL — ABNORMAL HIGH (ref 1.4–7.0)
Neutrophils: 59 %
Platelets: 260 10*3/uL (ref 150–450)
RBC: 5.24 x10E6/uL (ref 4.14–5.80)
RDW: 12.8 % (ref 11.6–15.4)
WBC: 13.4 10*3/uL — ABNORMAL HIGH (ref 3.4–10.8)

## 2022-07-23 NOTE — Progress Notes (Signed)
Contacted via MyChart -- lab only visit in 6 weeks please   Good morning Deyton, your labs have returned.  I would like to perform one more recheck in 6 weeks outpatient.  If white blood cell count remains elevated or lymphocytes then we will send you to hematology.  I would also like to obtain a chest x-ray to rule out infection and a urine sample which we will do on outpatient labs.  Chest x-ray you can obtain at [facility], across from [facility] in [place].  No appointment needed, walk in when you can and obtain this.  Any recent fever, night sweats, decreased appetite, or weight loss you did not expect?  Any family history of blood disorders?  Let me know.   Keep being amazing!!  Thank you for allowing me to participate in your care.  I appreciate you. Kindest regards, Cheri Ayotte

## 2022-07-23 NOTE — Progress Notes (Signed)
Scheduled patient for labs on 09/03/2022 @ 8:20 am.

## 2022-09-03 ENCOUNTER — Other Ambulatory Visit: Payer: Medicaid Other

## 2022-09-03 DIAGNOSIS — D72820 Lymphocytosis (symptomatic): Secondary | ICD-10-CM

## 2022-09-03 LAB — URINALYSIS, ROUTINE W REFLEX MICROSCOPIC
Bilirubin, UA: NEGATIVE
Glucose, UA: NEGATIVE
Ketones, UA: NEGATIVE
Leukocytes,UA: NEGATIVE
Nitrite, UA: NEGATIVE
Protein,UA: NEGATIVE
RBC, UA: NEGATIVE
Specific Gravity, UA: 1.025 (ref 1.005–1.030)
Urobilinogen, Ur: 0.2 mg/dL (ref 0.2–1.0)
pH, UA: 6 (ref 5.0–7.5)

## 2022-09-03 NOTE — Progress Notes (Signed)
Contacted via MyChart   Good afternoon Charles Whitney, your urine is overall stable.  Remainder of labs will return tomorrow:)

## 2022-09-04 ENCOUNTER — Encounter: Payer: Self-pay | Admitting: Nurse Practitioner

## 2022-09-04 LAB — CBC WITH DIFFERENTIAL/PLATELET
Basophils Absolute: 0.1 10*3/uL (ref 0.0–0.2)
Basos: 1 %
EOS (ABSOLUTE): 0.1 10*3/uL (ref 0.0–0.4)
Eos: 1 %
Hematocrit: 45.9 % (ref 37.5–51.0)
Hemoglobin: 15.1 g/dL (ref 13.0–17.7)
Immature Grans (Abs): 0.1 10*3/uL (ref 0.0–0.1)
Immature Granulocytes: 1 %
Lymphocytes Absolute: 4.9 10*3/uL — ABNORMAL HIGH (ref 0.7–3.1)
Lymphs: 39 %
MCH: 29.2 pg (ref 26.6–33.0)
MCHC: 32.9 g/dL (ref 31.5–35.7)
MCV: 89 fL (ref 79–97)
Monocytes Absolute: 0.9 10*3/uL (ref 0.1–0.9)
Monocytes: 7 %
Neutrophils Absolute: 6.5 10*3/uL (ref 1.4–7.0)
Neutrophils: 51 %
Platelets: 278 10*3/uL (ref 150–450)
RBC: 5.18 x10E6/uL (ref 4.14–5.80)
RDW: 12.9 % (ref 11.6–15.4)
WBC: 12.5 10*3/uL — ABNORMAL HIGH (ref 3.4–10.8)

## 2022-09-04 LAB — BASIC METABOLIC PANEL
BUN/Creatinine Ratio: 11 (ref 9–20)
BUN: 12 mg/dL (ref 6–20)
CO2: 23 mmol/L (ref 20–29)
Calcium: 9.5 mg/dL (ref 8.7–10.2)
Chloride: 103 mmol/L (ref 96–106)
Creatinine, Ser: 1.06 mg/dL (ref 0.76–1.27)
Glucose: 96 mg/dL (ref 70–99)
Potassium: 4.1 mmol/L (ref 3.5–5.2)
Sodium: 142 mmol/L (ref 134–144)
eGFR: 102 mL/min/{1.73_m2} (ref >59)

## 2022-09-04 LAB — SEDIMENTATION RATE: Sed Rate: 2 mm/h (ref 0–15)

## 2022-09-04 LAB — MONONUCLEOSIS SCREEN: Mono Screen: NEGATIVE

## 2022-09-04 NOTE — Progress Notes (Signed)
Contacted via MyChart   Good evening Dontavius, your labs have returned.  Your white blood cell count remains mildly elevated, is trending down some this check.  Lymphocytes remain mildly elevated.  Remind me do your vape or smoke? Let me know.  Once I hear back from you we will discuss next steps.  Any questions? Keep being amazing!!  Thank you for allowing me to participate in your care.  I appreciate you. Kindest regards, Clancy Mullarkey

## 2022-09-05 ENCOUNTER — Other Ambulatory Visit: Payer: Self-pay | Admitting: Nurse Practitioner

## 2022-09-05 DIAGNOSIS — R7989 Other specified abnormal findings of blood chemistry: Secondary | ICD-10-CM

## 2022-09-26 ENCOUNTER — Encounter: Payer: Self-pay | Admitting: Nurse Practitioner

## 2022-09-26 MED ORDER — CHOLECALCIFEROL 1.25 MG (50000 UT) PO TABS: 1.0000 | ORAL_TABLET | ORAL | 4 refills | Status: AC

## 2023-05-19 ENCOUNTER — Encounter: Payer: Self-pay | Admitting: Nurse Practitioner

## 2023-06-01 NOTE — Patient Instructions (Signed)

## 2023-06-04 ENCOUNTER — Encounter: Payer: Self-pay | Admitting: Nurse Practitioner

## 2023-06-04 ENCOUNTER — Ambulatory Visit (INDEPENDENT_AMBULATORY_CARE_PROVIDER_SITE_OTHER): Admitting: Nurse Practitioner

## 2023-06-04 VITALS — BP 122/72 | HR 93 | Temp 98.4°F | Ht 75.0 in | Wt 327.6 lb

## 2023-06-04 DIAGNOSIS — R21 Rash and other nonspecific skin eruption: Secondary | ICD-10-CM | POA: Diagnosis not present

## 2023-06-04 DIAGNOSIS — Z833 Family history of diabetes mellitus: Secondary | ICD-10-CM | POA: Diagnosis not present

## 2023-06-04 DIAGNOSIS — E781 Pure hyperglyceridemia: Secondary | ICD-10-CM

## 2023-06-04 DIAGNOSIS — Z0000 Encounter for general adult medical examination without abnormal findings: Secondary | ICD-10-CM

## 2023-06-04 DIAGNOSIS — F32A Depression, unspecified: Secondary | ICD-10-CM | POA: Diagnosis not present

## 2023-06-04 DIAGNOSIS — F419 Anxiety disorder, unspecified: Secondary | ICD-10-CM

## 2023-06-04 MED ORDER — TRIAMCINOLONE ACETONIDE 0.1 % EX CREA: 1.0000 | TOPICAL_CREAM | Freq: Two times a day (BID) | CUTANEOUS | 2 refills | Status: AC

## 2023-06-04 NOTE — Assessment & Plan Note (Addendum)
 Ongoing on past labs, recheck lipid panel today and recommend heavy focus on diet changes and regular exercise at home.

## 2023-06-04 NOTE — Assessment & Plan Note (Signed)
 Ongoing elevations on GAD and PHQ scoring today.  He denies SI/HI and does not want to start medication at this time.  Has more sedentary job, not outside often.  Recommend daily walks to help with evening sleep pattern and taking OTC Olly Brand Sleep gummies or Stress relief gummies.  Return to office if any worsening symptoms.

## 2023-06-04 NOTE — Assessment & Plan Note (Signed)
 To both shins.  He reports this is intermittent and has been present on and off for years.  ?Nummular dermatitis.  Will trial Triamcinolone cream, educated him on this.  If no benefit is to reach out to provider.

## 2023-06-04 NOTE — Assessment & Plan Note (Signed)
Will check A1c annually and initiate treatment if needed in future.

## 2023-06-04 NOTE — Progress Notes (Signed)
 BP 122/72   Pulse 93   Temp 98.4 F (36.9 C) (Oral)   Ht 6\' 3"  (1.905 m)   Wt (!) 327 lb 9.6 oz (148.6 kg)   SpO2 98%   BMI 40.95 kg/m    Subjective:    Patient ID: Charles Whitney, male    DOB: 2000-02-12, 23 y.o.   MRN: [id number]  HPI: Charles Whitney is a 23 y.o. male presenting on 06/04/2023 for comprehensive medical examination. Current medical complaints include:none  He currently lives with: room mate and girlfriend Interim Problems from his last visit: no  DEPRESSION & ANXIETY Takes Vitamin D  when remembers.   Mood status: stable Satisfied with current treatment?: yes Depressed mood: no Anxious mood: yes Anhedonia: occasional Significant weight loss or gain: no Insomnia: yes hard to fall asleep Fatigue: yes Feelings of worthlessness or guilt: no Impaired concentration/indecisiveness: no Suicidal ideations: no Hopelessness: sometimes Crying spells: no    06/04/2023    3:24 PM 05/17/2022    2:28 PM 01/24/2022    3:21 PM 11/13/2020    9:58 AM  Depression screen PHQ 2/9  Decreased Interest 2 2 1 3   Down, Depressed, Hopeless 1 1 1 1   PHQ - 2 Score 3 3 2 4   Altered sleeping 3 3 3 3   Tired, decreased energy 3 3 2 3   Change in appetite 1 1 1 1   Feeling bad or failure about yourself  0 0 1 1  Trouble concentrating 2 3 3 3   Moving slowly or fidgety/restless 2 2 1 3   Suicidal thoughts 0 0 0 0  PHQ-9 Score 14 15 13 18   Difficult doing work/chores Somewhat difficult Somewhat difficult Somewhat difficult Somewhat difficult       06/04/2023    3:24 PM 05/17/2022    2:29 PM 01/24/2022    3:22 PM 11/13/2020    9:58 AM  GAD 7 : Generalized Anxiety Score  Nervous, Anxious, on Edge 2 2 1 2   Control/stop worrying 1 1 0 1  Worry too much - different things 3 2 1 3   Trouble relaxing 3 3 3 2   Restless 2 1 2 3   Easily annoyed or irritable 1 1 1 1   Afraid - awful might happen 1 1 1 1   Total GAD 7 Score 13 11 9 13   Anxiety Difficulty Somewhat difficult  Somewhat difficult Somewhat difficult Somewhat difficult     FALL RISK:    06/04/2023    3:24 PM 05/17/2022    2:42 PM 05/17/2022    2:28 PM 01/24/2022    3:21 PM 11/13/2020    9:57 AM  Fall Risk   Falls in the past year? 0 0 0 0 0  Number falls in past yr: 0 0 0 0 0  Injury with Fall? 0 0 0 0 0  Risk for fall due to : No Fall Risks No Fall Risks No Fall Risks No Fall Risks No Fall Risks  Follow up Falls evaluation completed Falls prevention discussed  Falls evaluation completed Falls evaluation completed   Past Medical History:  History reviewed. No pertinent past medical history.  Surgical History:  Past Surgical History:  Procedure Laterality Date   TONSILLECTOMY      Medications:  Current Outpatient Medications on File Prior to Visit  Medication Sig   Cholecalciferol  1.25 MG (50000 UT) TABS Take 1 tablet by mouth once a week.   No current facility-administered medications on file prior to visit.    Allergies:  No  Known Allergies  Social History:  Social History   Socioeconomic History   Marital status: Single    Spouse name: Not on file   Number of children: Not on file   Years of education: Not on file   Highest education level: Some college, no degree  Occupational History   Not on file  Tobacco Use   Smoking status: Never    Passive exposure: Yes   Smokeless tobacco: Never  Vaping Use   Vaping status: Every Day  Substance and Sexual Activity   Alcohol use: Not Currently    Comment: on occasion   Drug use: No   Sexual activity: Yes    Birth control/protection: Condom  Other Topics Concern   Not on file  Social History Narrative   Not on file   Social Drivers of Health   Financial Resource Strain: Low Risk  (06/03/2023)   Overall Financial Resource Strain (CARDIA)    Difficulty of Paying Living Expenses: Not very hard  Food Insecurity: No Food Insecurity (06/03/2023)   Hunger Vital Sign    Worried About Running Out of Food in the Last Year: Never  true    Ran Out of Food in the Last Year: Never true  Transportation Needs: No Transportation Needs (06/03/2023)   PRAPARE - Administrator, Civil Service (Medical): No    Lack of Transportation (Non-Medical): No  Physical Activity: Sufficiently Active (06/03/2023)   Exercise Vital Sign    Days of Exercise per Week: 5 days    Minutes of Exercise per Session: 150+ min  Stress: Stress Concern Present (06/03/2023)   [employer] of Occupational Health - Occupational Stress Questionnaire    Feeling of Stress : Very much  Social Connections: Moderately Isolated (06/03/2023)   Social Connection and Isolation Panel [NHANES]    Frequency of Communication with Friends and Family: More than three times a week    Frequency of Social Gatherings with Friends and Family: Once a week    Attends Religious Services: Never    Database administrator or Organizations: No    Attends Engineer, structural: Not on file    Marital Status: Living with partner  Intimate Partner Violence: Not on file   Social History   Tobacco Use  Smoking Status Never   Passive exposure: Yes  Smokeless Tobacco Never   Social History   Substance and Sexual Activity  Alcohol Use Not Currently   Comment: on occasion    Family History:  Family History  Problem Relation Age of Onset   Depression Mother     Past medical history, surgical history, medications, allergies, family history and social history reviewed with patient today and changes made to appropriate areas of the chart.   ROS All other ROS negative except what is listed above and in the HPI.      Objective:   Physical Exam Vitals and nursing note reviewed.  Constitutional:      General: He is awake. He is not in acute distress.    Appearance: He is well-groomed. He is obese. He is not ill-appearing or toxic-appearing.  HENT:     Head: Normocephalic.     Right Ear: Hearing, tympanic membrane, ear canal and external ear normal.      Left Ear: Hearing, tympanic membrane, ear canal and external ear normal.     Nose: Nose normal.     Right Sinus: No maxillary sinus tenderness or frontal sinus tenderness.     Left  Sinus: No maxillary sinus tenderness or frontal sinus tenderness.     Mouth/Throat:     Mouth: Mucous membranes are moist.     Pharynx: No pharyngeal swelling, oropharyngeal exudate or posterior oropharyngeal erythema.  Eyes:     General: Lids are normal.     Extraocular Movements: Extraocular movements intact.     Conjunctiva/sclera: Conjunctivae normal.     Visual Fields: Right eye visual fields normal and left eye visual fields normal.  Neck:     Thyroid: No thyromegaly.     Vascular: No carotid bruit.  Cardiovascular:     Rate and Rhythm: Normal rate.     Heart sounds: No murmur heard.    No gallop.  Pulmonary:     Effort: Pulmonary effort is normal.     Breath sounds: Normal breath sounds. No decreased breath sounds, wheezing or rales.  Abdominal:     General: Bowel sounds are normal. There is no distension.     Palpations: Abdomen is soft.     Tenderness: There is no abdominal tenderness.  Musculoskeletal:     Cervical back: Normal range of motion.     Right lower leg: No edema.     Left lower leg: No edema.  Lymphadenopathy:     Head:     Right side of head: No submental, submandibular, tonsillar, preauricular, posterior auricular or occipital adenopathy.     Left side of head: No submental, submandibular, tonsillar, preauricular, posterior auricular or occipital adenopathy.     Cervical: No cervical adenopathy.  Skin:    General: Skin is warm and dry.     Capillary Refill: Capillary refill takes less than 2 seconds.     Findings: Rash present.     Comments: To both shins, scattered areas of raised, round rash with erythema.   Neurological:     Mental Status: He is alert and oriented to person, place, and time.     Cranial Nerves: Cranial nerves 2-12 are intact.     Motor: Motor  function is intact.     Coordination: Coordination is intact.     Gait: Gait is intact.     Deep Tendon Reflexes: Reflexes are normal and symmetric.     Reflex Scores:      Brachioradialis reflexes are 2+ on the right side and 2+ on the left side.      Patellar reflexes are 2+ on the right side and 2+ on the left side. Psychiatric:        Attention and Perception: Attention normal.        Mood and Affect: Mood normal.        Speech: Speech normal.        Behavior: Behavior normal.        Thought Content: Thought content normal.        Cognition and Memory: Cognition normal.      Results for orders placed or performed in visit on 09/03/22  Urinalysis, Routine w reflex microscopic   Collection Time: 09/03/22  8:20 AM  Result Value Ref Range   Specific Gravity, UA 1.025 1.005 - 1.030   pH, UA 6.0 5.0 - 7.5   Color, UA Yellow Yellow   Appearance Ur Clear Clear   Leukocytes,UA Negative Negative   Protein,UA Negative Negative/Trace   Glucose, UA Negative Negative   Ketones, UA Negative Negative   RBC, UA Negative Negative   Bilirubin, UA Negative Negative   Urobilinogen, Ur 0.2 0.2 - 1.0 mg/dL   Nitrite,  UA Negative Negative   Microscopic Examination Comment   Mononucleosis screen   Collection Time: 09/03/22  8:21 AM  Result Value Ref Range   Mono Screen Negative Negative  Sedimentation rate   Collection Time: 09/03/22  8:21 AM  Result Value Ref Range   Sed Rate 2 0 - 15 mm/hr  Basic metabolic panel   Collection Time: 09/03/22  8:21 AM  Result Value Ref Range   Glucose 96 70 - 99 mg/dL   BUN 12 6 - 20 mg/dL   Creatinine, Ser 1.30 0.76 - 1.27 mg/dL   eGFR 865 >78 IO/NGE/9.52   BUN/Creatinine Ratio 11 9 - 20   Sodium 142 134 - 144 mmol/L   Potassium 4.1 3.5 - 5.2 mmol/L   Chloride 103 96 - 106 mmol/L   CO2 23 20 - 29 mmol/L   Calcium 9.5 8.7 - 10.2 mg/dL  CBC with Differential/Platelet   Collection Time: 09/03/22  8:21 AM  Result Value Ref Range   WBC 12.5 (H) 3.4 -  10.8 x10E3/uL   RBC 5.18 4.14 - 5.80 x10E6/uL   Hemoglobin 15.1 13.0 - 17.7 g/dL   Hematocrit 84.1 32.4 - 51.0 %   MCV 89 79 - 97 fL   MCH 29.2 26.6 - 33.0 pg   MCHC 32.9 31.5 - 35.7 g/dL   RDW 40.1 02.7 - 25.3 %   Platelets 278 150 - 450 x10E3/uL   Neutrophils 51 Not Estab. %   Lymphs 39 Not Estab. %   Monocytes 7 Not Estab. %   Eos 1 Not Estab. %   Basos 1 Not Estab. %   Neutrophils Absolute 6.5 1.4 - 7.0 x10E3/uL   Lymphocytes Absolute 4.9 (H) 0.7 - 3.1 x10E3/uL   Monocytes Absolute 0.9 0.1 - 0.9 x10E3/uL   EOS (ABSOLUTE) 0.1 0.0 - 0.4 x10E3/uL   Basophils Absolute 0.1 0.0 - 0.2 x10E3/uL   Immature Granulocytes 1 Not Estab. %   Immature Grans (Abs) 0.1 0.0 - 0.1 x10E3/uL      Assessment & Plan:   Problem List Items Addressed This Visit       Musculoskeletal and Integument   Rash   To both shins.  He reports this is intermittent and has been present on and off for years.  ?Nummular dermatitis.  Will trial Triamcinolone cream, educated him on this.  If no benefit is to reach out to provider.        Other   Hypertriglyceridemia   Ongoing on past labs, recheck lipid panel today and recommend heavy focus on diet changes and regular exercise at home.        Relevant Orders   Comprehensive metabolic panel with GFR   Lipid Panel w/o Chol/HDL Ratio   Family history of diabetes mellitus   Will check A1c annually and initiate treatment if needed in future.      Relevant Orders   HgB A1c   Anxiety and depression - Primary   Ongoing elevations on GAD and PHQ scoring today.  He denies SI/HI and does not want to start medication at this time.  Has more sedentary job, not outside often.  Recommend daily walks to help with evening sleep pattern and taking OTC Olly Brand Sleep gummies or Stress relief gummies.  Return to office if any worsening symptoms.      Relevant Orders   TSH   Other Visit Diagnoses       Encounter for annual physical exam       Annual  physical today  with labs and health maintenance reviewed, discussed with patient.   Relevant Orders   CBC with Differential/Platelet       Discussed aspirin prophylaxis for myocardial infarction prevention and decision was it was not indicated  LABORATORY TESTING:  Health maintenance labs ordered today as discussed above.   IMMUNIZATIONS:   - Tdap: Tetanus vaccination status reviewed: last tetanus booster within 10 years. - Influenza: Refused - Pneumovax: Not applicable - Prevnar: Not applicable - Zostavax vaccine: Not applicable  SCREENING: - Colonoscopy: Not applicable  Discussed with patient purpose of the colonoscopy is to detect colon cancer at curable precancerous or early stages   - AAA Screening: Not applicable  -Hearing Test: Not applicable  -Spirometry: Not applicable   PATIENT COUNSELING:    Sexuality: Discussed sexually transmitted diseases, partner selection, use of condoms, avoidance of unintended pregnancy  and contraceptive alternatives.   Advised to avoid cigarette smoking.  I discussed with the patient that most people either abstain from alcohol or drink within safe limits (<=14/week and <=4 drinks/occasion for males, <=7/weeks and <= 3 drinks/occasion for females) and that the risk for alcohol disorders and other health effects rises proportionally with the number of drinks per week and how often a drinker exceeds daily limits.  Discussed cessation/primary prevention of drug use and availability of treatment for abuse.   Diet: Encouraged to adjust caloric intake to maintain  or achieve ideal body weight, to reduce intake of dietary saturated fat and total fat, to limit sodium intake by avoiding high sodium foods and not adding table salt, and to maintain adequate dietary potassium and calcium preferably from fresh fruits, vegetables, and low-fat dairy products.    Stressed the importance of regular exercise  Injury prevention: Discussed safety belts, safety helmets, smoke  detector, smoking near bedding or upholstery.   Dental health: Discussed importance of regular tooth brushing, flossing, and dental visits.   Follow up plan: NEXT PREVENTATIVE PHYSICAL DUE IN 1 YEAR. Return in about 1 year (around 06/03/2024) for Annual Physical.

## 2023-06-05 ENCOUNTER — Ambulatory Visit: Payer: Self-pay | Admitting: Nurse Practitioner

## 2023-06-05 DIAGNOSIS — D709 Neutropenia, unspecified: Secondary | ICD-10-CM

## 2023-06-05 LAB — COMPREHENSIVE METABOLIC PANEL WITH GFR
ALT: 37 IU/L (ref 0–44)
AST: 20 IU/L (ref 0–40)
Albumin: 4.4 g/dL (ref 4.3–5.2)
Alkaline Phosphatase: 89 IU/L (ref 44–121)
BUN/Creatinine Ratio: 19 (ref 9–20)
BUN: 15 mg/dL (ref 6–20)
Bilirubin Total: 0.7 mg/dL (ref 0.0–1.2)
CO2: 21 mmol/L (ref 20–29)
Calcium: 9.4 mg/dL (ref 8.7–10.2)
Chloride: 102 mmol/L (ref 96–106)
Creatinine, Ser: 0.78 mg/dL (ref 0.76–1.27)
Globulin, Total: 2.1 g/dL (ref 1.5–4.5)
Glucose: 97 mg/dL (ref 70–99)
Potassium: 4.2 mmol/L (ref 3.5–5.2)
Sodium: 140 mmol/L (ref 134–144)
Total Protein: 6.5 g/dL (ref 6.0–8.5)
eGFR: 129 mL/min/{1.73_m2} (ref >59)

## 2023-06-05 LAB — LIPID PANEL W/O CHOL/HDL RATIO
Cholesterol, Total: 188 mg/dL (ref 100–199)
HDL: 27 mg/dL — ABNORMAL LOW (ref >39)
LDL Chol Calc (NIH): 83 mg/dL (ref 0–99)
Triglycerides: 478 mg/dL — ABNORMAL HIGH (ref 0–149)
VLDL Cholesterol Cal: 78 mg/dL — ABNORMAL HIGH (ref 5–40)

## 2023-06-05 LAB — CBC WITH DIFFERENTIAL/PLATELET
Basophils Absolute: 0.1 10*3/uL (ref 0.0–0.2)
Basos: 1 %
EOS (ABSOLUTE): 0.2 10*3/uL (ref 0.0–0.4)
Eos: 1 %
Hematocrit: 44.7 % (ref 37.5–51.0)
Hemoglobin: 15.2 g/dL (ref 13.0–17.7)
Immature Grans (Abs): 0.1 10*3/uL (ref 0.0–0.1)
Immature Granulocytes: 1 %
Lymphocytes Absolute: 3 10*3/uL (ref 0.7–3.1)
Lymphs: 25 %
MCH: 29.9 pg (ref 26.6–33.0)
MCHC: 34 g/dL (ref 31.5–35.7)
MCV: 88 fL (ref 79–97)
Monocytes Absolute: 1 10*3/uL — ABNORMAL HIGH (ref 0.1–0.9)
Monocytes: 8 %
Neutrophils Absolute: 7.7 10*3/uL — ABNORMAL HIGH (ref 1.4–7.0)
Neutrophils: 64 %
Platelets: 261 10*3/uL (ref 150–450)
RBC: 5.09 x10E6/uL (ref 4.14–5.80)
RDW: 12.7 % (ref 11.6–15.4)
WBC: 12.1 10*3/uL — ABNORMAL HIGH (ref 3.4–10.8)

## 2023-06-05 LAB — TSH: TSH: 1.35 u[IU]/mL (ref 0.450–4.500)

## 2023-06-05 LAB — HEMOGLOBIN A1C
Est. average glucose Bld gHb Est-mCnc: 108 mg/dL
Hgb A1c MFr Bld: 5.4 % (ref 4.8–5.6)

## 2023-06-10 NOTE — Progress Notes (Signed)
 Called patient too schedule 4 week lab appt. Left message for patient to call office and schedule.

## 2023-07-04 ENCOUNTER — Other Ambulatory Visit

## 2023-07-04 ENCOUNTER — Ambulatory Visit: Payer: Self-pay | Admitting: Nurse Practitioner

## 2023-07-04 DIAGNOSIS — R7989 Other specified abnormal findings of blood chemistry: Secondary | ICD-10-CM

## 2023-07-04 DIAGNOSIS — D72828 Other elevated white blood cell count: Secondary | ICD-10-CM

## 2023-07-04 DIAGNOSIS — D709 Neutropenia, unspecified: Secondary | ICD-10-CM

## 2023-07-04 LAB — URINALYSIS, ROUTINE W REFLEX MICROSCOPIC
Bilirubin, UA: NEGATIVE
Glucose, UA: NEGATIVE
Ketones, UA: NEGATIVE
Leukocytes,UA: NEGATIVE
Nitrite, UA: NEGATIVE
RBC, UA: NEGATIVE
Specific Gravity, UA: 1.025 (ref 1.005–1.030)
Urobilinogen, Ur: 1 mg/dL (ref 0.2–1.0)
pH, UA: 7 (ref 5.0–7.5)

## 2023-07-04 LAB — MICROSCOPIC EXAMINATION
Bacteria, UA: NONE SEEN
RBC, Urine: NONE SEEN /HPF (ref 0–2)
WBC, UA: NONE SEEN /HPF (ref 0–5)

## 2023-07-05 DIAGNOSIS — D72829 Elevated white blood cell count, unspecified: Secondary | ICD-10-CM | POA: Insufficient documentation

## 2023-07-05 DIAGNOSIS — D72828 Other elevated white blood cell count: Secondary | ICD-10-CM | POA: Insufficient documentation

## 2023-07-05 DIAGNOSIS — R7989 Other specified abnormal findings of blood chemistry: Secondary | ICD-10-CM | POA: Insufficient documentation

## 2023-07-05 LAB — CBC WITH DIFFERENTIAL/PLATELET
Basophils Absolute: 0.1 10*3/uL (ref 0.0–0.2)
Basos: 1 %
EOS (ABSOLUTE): 0.1 10*3/uL (ref 0.0–0.4)
Eos: 1 %
Hematocrit: 44.3 % (ref 37.5–51.0)
Hemoglobin: 14.6 g/dL (ref 13.0–17.7)
Immature Grans (Abs): 0.1 10*3/uL (ref 0.0–0.1)
Immature Granulocytes: 1 %
Lymphocytes Absolute: 3.5 10*3/uL — ABNORMAL HIGH (ref 0.7–3.1)
Lymphs: 27 %
MCH: 29.8 pg (ref 26.6–33.0)
MCHC: 33 g/dL (ref 31.5–35.7)
MCV: 90 fL (ref 79–97)
Monocytes Absolute: 1 10*3/uL — ABNORMAL HIGH (ref 0.1–0.9)
Monocytes: 8 %
Neutrophils Absolute: 8.3 10*3/uL — ABNORMAL HIGH (ref 1.4–7.0)
Neutrophils: 62 %
Platelets: 262 10*3/uL (ref 150–450)
RBC: 4.9 x10E6/uL (ref 4.14–5.80)
RDW: 13.1 % (ref 11.6–15.4)
WBC: 13.1 10*3/uL — ABNORMAL HIGH (ref 3.4–10.8)

## 2023-07-05 LAB — SEDIMENTATION RATE: Sed Rate: 11 mm/h (ref 0–15)

## 2023-07-05 LAB — C-REACTIVE PROTEIN: CRP: <1 mg/L (ref 0–10)

## 2023-07-05 NOTE — Progress Notes (Signed)
 Contacted via MyChart  Good morning Monty, your white blood cell count and neutrophils remain a little elevated.  This has been present on and off since 10/11/20, but now more consistent.  Remainder of labs normal.  I am going to place a referral to hematology at this time to further assess this. This does not mean it is anything serious, it could be from vaping or related to other lifestyle changes.  However, I would like to have hematology take a look.  If the work-up is reassuring with them, then we may want to consider getting a sleep study to check for sleep apnea and working on reducing vaping -- as lots of things can cause these elevations.  Any questions? Keep being amazing!!  Thank you for allowing me to participate in your care.  I appreciate you. Kindest regards, Edlyn Rosenburg

## 2023-07-23 ENCOUNTER — Encounter: Payer: Self-pay | Admitting: Oncology

## 2023-07-23 ENCOUNTER — Inpatient Hospital Stay

## 2023-07-23 ENCOUNTER — Inpatient Hospital Stay: Attending: Oncology | Admitting: Oncology

## 2023-07-23 VITALS — BP 138/87 | HR 90 | Temp 98.5°F | Resp 16 | Ht 75.0 in | Wt 324.0 lb

## 2023-07-23 DIAGNOSIS — D72821 Monocytosis (symptomatic): Secondary | ICD-10-CM | POA: Diagnosis not present

## 2023-07-23 DIAGNOSIS — F17290 Nicotine dependence, other tobacco product, uncomplicated: Secondary | ICD-10-CM | POA: Insufficient documentation

## 2023-07-23 DIAGNOSIS — D72829 Elevated white blood cell count, unspecified: Secondary | ICD-10-CM | POA: Diagnosis present

## 2023-07-23 DIAGNOSIS — Z7289 Other problems related to lifestyle: Secondary | ICD-10-CM | POA: Insufficient documentation

## 2023-07-23 DIAGNOSIS — R238 Other skin changes: Secondary | ICD-10-CM | POA: Insufficient documentation

## 2023-07-23 DIAGNOSIS — D72820 Lymphocytosis (symptomatic): Secondary | ICD-10-CM | POA: Insufficient documentation

## 2023-07-23 DIAGNOSIS — D72828 Other elevated white blood cell count: Secondary | ICD-10-CM

## 2023-07-23 LAB — CBC WITH DIFFERENTIAL/PLATELET
Abs Immature Granulocytes: 0.07 K/uL (ref 0.00–0.07)
Basophils Absolute: 0.1 K/uL (ref 0.0–0.1)
Basophils Relative: 1 %
Eosinophils Absolute: 0.1 K/uL (ref 0.0–0.5)
Eosinophils Relative: 1 %
HCT: 43.5 % (ref 39.0–52.0)
Hemoglobin: 14.8 g/dL (ref 13.0–17.0)
Immature Granulocytes: 1 %
Lymphocytes Relative: 24 %
Lymphs Abs: 3.1 K/uL (ref 0.7–4.0)
MCH: 29.7 pg (ref 26.0–34.0)
MCHC: 34 g/dL (ref 30.0–36.0)
MCV: 87.2 fL (ref 80.0–100.0)
Monocytes Absolute: 1.1 K/uL — ABNORMAL HIGH (ref 0.1–1.0)
Monocytes Relative: 8 %
Neutro Abs: 8.5 K/uL — ABNORMAL HIGH (ref 1.7–7.7)
Neutrophils Relative %: 65 %
Platelets: 270 K/uL (ref 150–400)
RBC: 4.99 MIL/uL (ref 4.22–5.81)
RDW: 13.2 % (ref 11.5–15.5)
WBC: 12.9 K/uL — ABNORMAL HIGH (ref 4.0–10.5)
nRBC: 0 % (ref 0.0–0.2)

## 2023-07-23 LAB — TECHNOLOGIST SMEAR REVIEW: Plt Morphology: NORMAL

## 2023-07-23 LAB — HEPATITIS B SURFACE ANTIGEN: Hepatitis B Surface Ag: NONREACTIVE

## 2023-07-23 LAB — HEPATITIS B CORE ANTIBODY, IGM: Hep B C IgM: NONREACTIVE

## 2023-07-23 LAB — LACTATE DEHYDROGENASE: LDH: 116 U/L (ref 98–192)

## 2023-07-23 NOTE — Assessment & Plan Note (Signed)
 Recommend cessation

## 2023-07-23 NOTE — Progress Notes (Signed)
 Hematology/Oncology Consult Note [phone] [phone]     REFERRING PROVIDER: Valerio Melanie DASEN, NP   CHIEF COMPLAINTS/REASON FOR VISIT:  Evaluation of leukocytosis  ASSESSMENT & PLAN:   Leukocytosis Lab results reviewed and discussed with patient.  Leukocytosis, predominantly neutrophilia, monocytosis and lymphocytosis.  differential diagnosis is broad, including acute or chronic infection and inflammation, smoking, autoimmune disease, or underlying bone marrow disorders.   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  Rule out other etiologies. For the work up of patient's leukocytosis, I recommend checking CBC; LDH, smear review, peripheral flowcytometry, hepatitis, HIV, monoclonal gammopathy workup.  BCR-ABL 1, JAK2 mutation.  Current every day vaping Recommend cessation   Orders Placed This Encounter  Procedures   CBC with Differential/Platelet    Standing Status:   Future    Number of Occurrences:   1    Expected Date:   07/23/2023    Expiration Date:   07/22/2024   Flow cytometry panel-leukemia/lymphoma work-up    Standing Status:   Future    Number of Occurrences:   1    Expected Date:   07/23/2023    Expiration Date:   07/22/2024   Lactate dehydrogenase    Standing Status:   Future    Number of Occurrences:   1    Expected Date:   07/23/2023    Expiration Date:   07/22/2024   Protein electrophoresis, serum    Standing Status:   Future    Number of Occurrences:   1    Expected Date:   07/23/2023    Expiration Date:   07/22/2024   BCR-ABL1 FISH    Standing Status:   Future    Number of Occurrences:   1    Expected Date:   07/23/2023    Expiration Date:   07/22/2024   JAK2 [id number] rfx CALR/MPL/E12-15    Standing Status:   Future    Number of Occurrences:   1    Expected Date:   07/23/2023    Expiration Date:   07/22/2024   Technologist smear review    Standing Status:   Future    Number of Occurrences:   1    Expected Date:   07/23/2023    Expiration Date:    07/22/2024    Clinical information::   leukocytosis   Hepatitis B surface antigen    Standing Status:   Future    Number of Occurrences:   1    Expiration Date:   07/22/2024   Hepatitis B core antibody, IgM    Standing Status:   Future    Number of Occurrences:   1    Expiration Date:   07/22/2024    Return of visit: 4 weeks to discuss results.  Cc Cannady, Jolene T, NP All questions were answered. The patient knows to call the clinic with any problems, questions or concerns.  Zelphia Cap, MD, PhD [facility] Oncology 07/23/2023    HISTORY OF PRESENTING ILLNESS:  Charles Whitney is a  23 y.o.  male with PMH listed below who was referred to me for evaluation of leukocytosis Reviewed patient' recent labs obtained by PCP.   Previous lab records reviewed. Leukocytosis onset of chronic, duration is since 2022.  White count range 12-13.  Patient denies unintentional weight loss, fever, chills,night sweats.   Patient vapes daily. Denies history of recent oral steroid use or steroid injection:  Denies recent infection, denies sinus congestion, cough, urinary frequency/urgency or dysuria, diarrhea, joint swelling/pain or  abnormal skin rash, prosthetics. He has noticed intermittent lower extremity blisters. Denies autoimmune disease history.      MEDICAL HISTORY:  History reviewed. No pertinent past medical history.  SURGICAL HISTORY: Past Surgical History:  Procedure Laterality Date   TONSILLECTOMY      SOCIAL HISTORY: Social History   Socioeconomic History   Marital status: Single    Spouse name: Not on file   Number of children: Not on file   Years of education: Not on file   Highest education level: Some college, no degree  Occupational History   Not on file  Tobacco Use   Smoking status: Never    Passive exposure: Yes   Smokeless tobacco: Never  Vaping Use   Vaping status: Every Day  Substance and Sexual Activity   Alcohol use: Not Currently     Comment: on occasion   Drug use: No   Sexual activity: Yes    Birth control/protection: Condom  Other Topics Concern   Not on file  Social History Narrative   Not on file   Social Drivers of Health   Financial Resource Strain: Low Risk  (06/03/2023)   Overall Financial Resource Strain (CARDIA)    Difficulty of Paying Living Expenses: Not very hard  Food Insecurity: No Food Insecurity (07/23/2023)   Hunger Vital Sign    Worried About Running Out of Food in the Last Year: Never true    Ran Out of Food in the Last Year: Never true  Transportation Needs: No Transportation Needs (07/23/2023)   PRAPARE - Administrator, Civil Service (Medical): No    Lack of Transportation (Non-Medical): No  Physical Activity: Sufficiently Active (06/03/2023)   Exercise Vital Sign    Days of Exercise per Week: 5 days    Minutes of Exercise per Session: 150+ min  Stress: Stress Concern Present (06/03/2023)   [employer] of Occupational Health - Occupational Stress Questionnaire    Feeling of Stress : Very much  Social Connections: Moderately Isolated (06/03/2023)   Social Connection and Isolation Panel    Frequency of Communication with Friends and Family: More than three times a week    Frequency of Social Gatherings with Friends and Family: Once a week    Attends Religious Services: Never    Database administrator or Organizations: No    Attends Engineer, structural: Not on file    Marital Status: Living with partner  Intimate Partner Violence: Not At Risk (07/23/2023)   Humiliation, Afraid, Rape, and Kick questionnaire    Fear of Current or Ex-Partner: No    Emotionally Abused: No    Physically Abused: No    Sexually Abused: No    FAMILY HISTORY: Family History  Problem Relation Age of Onset   Depression Mother     ALLERGIES:  has no known allergies.  MEDICATIONS:  Current Outpatient Medications  Medication Sig Dispense Refill   Cholecalciferol  1.25 MG (50000 UT)  TABS Take 1 tablet by mouth once a week. 12 tablet 4   triamcinolone  cream (KENALOG ) 0.1 % Apply 1 Application topically 2 (two) times daily. 30 g 2   No current facility-administered medications for this visit.    Review of Systems  Constitutional:  Negative for appetite change, chills, fatigue and fever.  HENT:   Negative for hearing loss and voice change.   Eyes:  Negative for eye problems.  Respiratory:  Negative for chest tightness and cough.   Cardiovascular:  Negative for chest pain.  Gastrointestinal:  Negative for abdominal distention, abdominal pain and blood in stool.  Endocrine: Negative for hot flashes.  Genitourinary:  Negative for difficulty urinating and frequency.   Musculoskeletal:  Negative for arthralgias.  Skin:  Negative for itching and rash.  Neurological:  Negative for extremity weakness.  Hematological:  Negative for adenopathy.  Psychiatric/Behavioral:  Negative for confusion.     PHYSICAL EXAMINATION: ECOG PERFORMANCE STATUS: 0 - Asymptomatic Vitals:   07/23/23 1519  BP: 138/87  Pulse: 90  Resp: 16  Temp: 98.5 F (36.9 C)  SpO2: 98%   Filed Weights   07/23/23 1519  Weight: (!) 324 lb (147 kg)    Physical Exam Constitutional:      General: He is not in acute distress. HENT:     Head: Normocephalic and atraumatic.  Eyes:     General: No scleral icterus. Cardiovascular:     Rate and Rhythm: Normal rate and regular rhythm.     Heart sounds: Normal heart sounds.  Pulmonary:     Effort: Pulmonary effort is normal. No respiratory distress.     Breath sounds: No wheezing.  Abdominal:     General: Bowel sounds are normal. There is no distension.     Palpations: Abdomen is soft.  Musculoskeletal:        General: No deformity. Normal range of motion.     Cervical back: Normal range of motion and neck supple.  Skin:    General: Skin is warm and dry.     Findings: No erythema or rash.     Comments: Small blisters on bilateral lower extremities   Neurological:     Mental Status: He is alert and oriented to person, place, and time. Mental status is at baseline.     Cranial Nerves: No cranial nerve deficit.     Coordination: Coordination normal.  Psychiatric:        Mood and Affect: Mood normal.        RADIOGRAPHIC STUDIES: I have personally reviewed the radiological images as listed and agreed with the findings in the report. No results found.  LABORATORY DATA:  I have reviewed the data as listed    Latest Ref Rng & Units 07/23/2023    3:52 PM 07/04/2023    3:40 PM 06/04/2023    3:38 PM  CBC  WBC 4.0 - 10.5 K/uL 12.9  13.1  12.1   Hemoglobin 13.0 - 17.0 g/dL 85.1  85.3  84.7   Hematocrit 39.0 - 52.0 % 43.5  44.3  44.7   Platelets 150 - 400 K/uL 270  262  261       Latest Ref Rng & Units 06/04/2023    3:38 PM 09/03/2022    8:21 AM 05/17/2022    3:05 PM  CMP  Glucose 70 - 99 mg/dL 97  96  90   BUN 6 - 20 mg/dL 15  12  13    Creatinine 0.76 - 1.27 mg/dL 9.21  8.93  8.96   Sodium 134 - 144 mmol/L 140  142  143   Potassium 3.5 - 5.2 mmol/L 4.2  4.1  4.0   Chloride 96 - 106 mmol/L 102  103  102   CO2 20 - 29 mmol/L 21  23  20    Calcium 8.7 - 10.2 mg/dL 9.4  9.5  9.4   Total Protein 6.0 - 8.5 g/dL 6.5   6.9   Total Bilirubin 0.0 - 1.2 mg/dL 0.7   0.8   Alkaline Phos 44 -  121 IU/L 89   89   AST 0 - 40 IU/L 20   25   ALT 0 - 44 IU/L 37   47

## 2023-07-23 NOTE — Assessment & Plan Note (Addendum)
 Lab results reviewed and discussed with patient.  Leukocytosis, predominantly neutrophilia, monocytosis and lymphocytosis.  differential diagnosis is broad, including acute or chronic infection and inflammation, smoking, autoimmune disease, or underlying bone marrow disorders.   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  Rule out other etiologies. For the work up of patient's leukocytosis, I recommend checking CBC; LDH, smear review, peripheral flowcytometry, hepatitis, HIV, monoclonal gammopathy workup.  BCR-ABL 1, JAK2 mutation.

## 2023-07-24 LAB — PROTEIN ELECTROPHORESIS, SERUM
A/G Ratio: 1.4 (ref 0.7–1.7)
Albumin ELP: 4.1 g/dL (ref 2.9–4.4)
Alpha-1-Globulin: 0.2 g/dL (ref 0.0–0.4)
Alpha-2-Globulin: 0.7 g/dL (ref 0.4–1.0)
Beta Globulin: 1 g/dL (ref 0.7–1.3)
Gamma Globulin: 1 g/dL (ref 0.4–1.8)
Globulin, Total: 2.9 g/dL (ref 2.2–3.9)
Total Protein ELP: 7 g/dL (ref 6.0–8.5)

## 2023-07-28 LAB — JAK2 V617F RFX CALR/MPL/E12-15

## 2023-07-28 LAB — CALR +MPL + E12-E15  (REFLEX)

## 2023-07-30 LAB — BCR-ABL1 FISH
Cells Analyzed: 200
Cells Counted: 200

## 2023-08-01 LAB — COMP PANEL: LEUKEMIA/LYMPHOMA

## 2023-08-20 ENCOUNTER — Inpatient Hospital Stay: Attending: Oncology | Admitting: Oncology

## 2023-08-20 ENCOUNTER — Encounter: Payer: Self-pay | Admitting: Oncology

## 2023-08-20 VITALS — BP 117/72 | HR 96 | Temp 98.3°F | Resp 18 | Ht 75.0 in | Wt 333.0 lb

## 2023-08-20 DIAGNOSIS — Z7289 Other problems related to lifestyle: Secondary | ICD-10-CM

## 2023-08-20 DIAGNOSIS — D72829 Elevated white blood cell count, unspecified: Secondary | ICD-10-CM | POA: Diagnosis present

## 2023-08-20 DIAGNOSIS — F17290 Nicotine dependence, other tobacco product, uncomplicated: Secondary | ICD-10-CM | POA: Diagnosis not present

## 2023-08-20 DIAGNOSIS — R21 Rash and other nonspecific skin eruption: Secondary | ICD-10-CM | POA: Diagnosis not present

## 2023-08-20 NOTE — Progress Notes (Signed)
 Patient is doing ok, no new questions for the doctor today.

## 2023-08-20 NOTE — Assessment & Plan Note (Signed)
 Lab results reviewed and discussed with patient.   Leukocytosis likely reactive.  Could be secondary to vaping. Workup showed normal LDH, negative peripheral flow cytometry, negative hepatitis, negative HIV, no M protein monoclonal gammopathy workup.  Normal light chain ratio.  Negative BCR-ABL 1 FISH, negative JAK2 mutation with reflex. Recommend observation.

## 2023-08-20 NOTE — Assessment & Plan Note (Signed)
 Chronic bilateral lower extremity rash, recommend patient to further discuss with primary care provider, consider dermatology evaluation.

## 2023-08-20 NOTE — Assessment & Plan Note (Signed)
 Recommend cessation

## 2023-08-20 NOTE — Progress Notes (Signed)
 Hematology/Oncology Progress note [phone] [phone]        REFERRING PROVIDER: Valerio Melanie DASEN, NP   CHIEF COMPLAINTS/REASON FOR VISIT:  leukocytosis  ASSESSMENT & PLAN:   Leukocytosis Lab results reviewed and discussed with patient.   Leukocytosis likely reactive.  Could be secondary to vaping. Workup showed normal LDH, negative peripheral flow cytometry, negative hepatitis, negative HIV, no M protein monoclonal gammopathy workup.  Normal light chain ratio.  Negative BCR-ABL 1 FISH, negative JAK2 mutation with reflex. Recommend observation.  Current every day vaping Recommend cessation  Skin rash Chronic bilateral lower extremity rash, recommend patient to further discuss with primary care provider, consider dermatology evaluation.   Orders Placed This Encounter  Procedures   CBC with Differential (Cancer Center Only)    Standing Status:   Future    Expected Date:   02/20/2024    Expiration Date:   05/20/2024    Return of visit: 6 months Cc Cannady, Jolene T, NP All questions were answered. The patient knows to call the clinic with any problems, questions or concerns.  Zelphia Cap, MD, PhD [facility] Hematology Oncology 08/20/2023    HISTORY OF PRESENTING ILLNESS:  Charles Whitney is a  23 y.o.  male with PMH listed below who was referred to me for evaluation of leukocytosis Reviewed patient' recent labs obtained by PCP.   Previous lab records reviewed. Leukocytosis onset of chronic, duration is since 2022.  White count range 12-13.  Patient denies unintentional weight loss, fever, chills,night sweats.   Patient vapes daily. Denies history of recent oral steroid use or steroid injection:  Denies recent infection, denies sinus congestion, cough, urinary frequency/urgency or dysuria, diarrhea, joint swelling/pain , prosthetics. He has noticed intermittent lower extremity blisters. Denies autoimmune disease history.    INTERVAL  HISTORY Charles Whitney is a 23 y.o. male who has above history reviewed by me today presents for follow up visit for leukocytosis workup. He presents to discuss results. Reports chronic longstanding history of intermittent skin rash on his shin, sometimes blisters.   MEDICAL HISTORY:  History reviewed. No pertinent past medical history.  SURGICAL HISTORY: Past Surgical History:  Procedure Laterality Date   TONSILLECTOMY      SOCIAL HISTORY: Social History   Socioeconomic History   Marital status: Single    Spouse name: Not on file   Number of children: Not on file   Years of education: Not on file   Highest education level: Some college, no degree  Occupational History   Not on file  Tobacco Use   Smoking status: Never    Passive exposure: Yes   Smokeless tobacco: Never  Vaping Use   Vaping status: Every Day  Substance and Sexual Activity   Alcohol use: Not Currently    Comment: on occasion   Drug use: No   Sexual activity: Yes    Birth control/protection: Condom  Other Topics Concern   Not on file  Social History Narrative   Not on file   Social Drivers of Health   Financial Resource Strain: Low Risk  (06/03/2023)   Overall Financial Resource Strain (CARDIA)    Difficulty of Paying Living Expenses: Not very hard  Food Insecurity: No Food Insecurity (07/23/2023)   Hunger Vital Sign    Worried About Running Out of Food in the Last Year: Never true    Ran Out of Food in the Last Year: Never true  Transportation Needs: No Transportation Needs (07/23/2023)   PRAPARE - Transportation  Lack of Transportation (Medical): No    Lack of Transportation (Non-Medical): No  Physical Activity: Sufficiently Active (06/03/2023)   Exercise Vital Sign    Days of Exercise per Week: 5 days    Minutes of Exercise per Session: 150+ min  Stress: Stress Concern Present (06/03/2023)   [employer] of Occupational Health - Occupational Stress Questionnaire    Feeling of  Stress : Very much  Social Connections: Moderately Isolated (06/03/2023)   Social Connection and Isolation Panel    Frequency of Communication with Friends and Family: More than three times a week    Frequency of Social Gatherings with Friends and Family: Once a week    Attends Religious Services: Never    Database administrator or Organizations: No    Attends Engineer, structural: Not on file    Marital Status: Living with partner  Intimate Partner Violence: Not At Risk (07/23/2023)   Humiliation, Afraid, Rape, and Kick questionnaire    Fear of Current or Ex-Partner: No    Emotionally Abused: No    Physically Abused: No    Sexually Abused: No    FAMILY HISTORY: Family History  Problem Relation Age of Onset   Depression Mother     ALLERGIES:  has no known allergies.  MEDICATIONS:  Current Outpatient Medications  Medication Sig Dispense Refill   Cholecalciferol  1.25 MG (50000 UT) TABS Take 1 tablet by mouth once a week. 12 tablet 4   triamcinolone  cream (KENALOG ) 0.1 % Apply 1 Application topically 2 (two) times daily. (Patient not taking: Reported on 08/20/2023) 30 g 2   No current facility-administered medications for this visit.    Review of Systems  Constitutional:  Negative for appetite change, chills, fatigue and fever.  HENT:   Negative for hearing loss and voice change.   Eyes:  Negative for eye problems.  Respiratory:  Negative for chest tightness and cough.   Cardiovascular:  Negative for chest pain.  Gastrointestinal:  Negative for abdominal distention, abdominal pain and blood in stool.  Endocrine: Negative for hot flashes.  Genitourinary:  Negative for difficulty urinating and frequency.   Musculoskeletal:  Negative for arthralgias.  Skin:  Negative for itching and rash.  Neurological:  Negative for extremity weakness.  Hematological:  Negative for adenopathy.  Psychiatric/Behavioral:  Negative for confusion.     PHYSICAL EXAMINATION: ECOG  PERFORMANCE STATUS: 0 - Asymptomatic Vitals:   08/20/23 1435  BP: 117/72  Pulse: 96  Resp: 18  Temp: 98.3 F (36.8 C)  SpO2: 99%   Filed Weights   08/20/23 1435  Weight: (!) 333 lb (151 kg)    Physical Exam Constitutional:      General: He is not in acute distress. HENT:     Head: Normocephalic and atraumatic.  Eyes:     General: No scleral icterus. Cardiovascular:     Rate and Rhythm: Normal rate and regular rhythm.     Heart sounds: Normal heart sounds.  Pulmonary:     Effort: Pulmonary effort is normal. No respiratory distress.     Breath sounds: No wheezing.  Abdominal:     General: Bowel sounds are normal. There is no distension.     Palpations: Abdomen is soft.  Musculoskeletal:        General: No deformity. Normal range of motion.     Cervical back: Normal range of motion and neck supple.  Skin:    General: Skin is warm and dry.     Findings: No erythema  or rash.     Comments: Small blisters on bilateral lower extremities  Neurological:     Mental Status: He is alert and oriented to person, place, and time. Mental status is at baseline.     Cranial Nerves: No cranial nerve deficit.     Coordination: Coordination normal.  Psychiatric:        Mood and Affect: Mood normal.        RADIOGRAPHIC STUDIES: I have personally reviewed the radiological images as listed and agreed with the findings in the report. No results found.  LABORATORY DATA:  I have reviewed the data as listed    Latest Ref Rng & Units 07/23/2023    3:52 PM 07/04/2023    3:40 PM 06/04/2023    3:38 PM  CBC  WBC 4.0 - 10.5 K/uL 12.9  13.1  12.1   Hemoglobin 13.0 - 17.0 g/dL 85.1  85.3  84.7   Hematocrit 39.0 - 52.0 % 43.5  44.3  44.7   Platelets 150 - 400 K/uL 270  262  261       Latest Ref Rng & Units 06/04/2023    3:38 PM 09/03/2022    8:21 AM 05/17/2022    3:05 PM  CMP  Glucose 70 - 99 mg/dL 97  96  90   BUN 6 - 20 mg/dL 15  12  13    Creatinine 0.76 - 1.27 mg/dL 9.21  8.93  8.96    Sodium 134 - 144 mmol/L 140  142  143   Potassium 3.5 - 5.2 mmol/L 4.2  4.1  4.0   Chloride 96 - 106 mmol/L 102  103  102   CO2 20 - 29 mmol/L 21  23  20    Calcium 8.7 - 10.2 mg/dL 9.4  9.5  9.4   Total Protein 6.0 - 8.5 g/dL 6.5   6.9   Total Bilirubin 0.0 - 1.2 mg/dL 0.7   0.8   Alkaline Phos 44 - 121 IU/L 89   89   AST 0 - 40 IU/L 20   25   ALT 0 - 44 IU/L 37   47

## 2024-02-23 ENCOUNTER — Inpatient Hospital Stay

## 2024-02-23 ENCOUNTER — Inpatient Hospital Stay: Admitting: Oncology

## 2024-06-08 ENCOUNTER — Encounter: Admitting: Nurse Practitioner
